# Patient Record
Sex: Female | Born: 1935 | Race: White | Hispanic: No | Smoking: Never smoker
Health system: Southern US, Community
[De-identification: ages and names within clinical notes are randomized; demographics above are authoritative.]

## PROBLEM LIST (undated history)

## (undated) DIAGNOSIS — M1990 Unspecified osteoarthritis, unspecified site: Secondary | ICD-10-CM

## (undated) DIAGNOSIS — I639 Cerebral infarction, unspecified: Secondary | ICD-10-CM

## (undated) DIAGNOSIS — C50319 Malignant neoplasm of lower-inner quadrant of unspecified female breast: Secondary | ICD-10-CM

## (undated) DIAGNOSIS — C44621 Squamous cell carcinoma of skin of unspecified upper limb, including shoulder: Secondary | ICD-10-CM

## (undated) DIAGNOSIS — E538 Deficiency of other specified B group vitamins: Secondary | ICD-10-CM

## (undated) DIAGNOSIS — I10 Essential (primary) hypertension: Secondary | ICD-10-CM

## (undated) DIAGNOSIS — C4490 Unspecified malignant neoplasm of skin, unspecified: Secondary | ICD-10-CM

## (undated) DIAGNOSIS — G629 Polyneuropathy, unspecified: Secondary | ICD-10-CM

## (undated) DIAGNOSIS — R7303 Prediabetes: Secondary | ICD-10-CM

## (undated) DIAGNOSIS — C7640 Malignant neoplasm of unspecified upper limb: Secondary | ICD-10-CM

## (undated) DIAGNOSIS — K635 Polyp of colon: Secondary | ICD-10-CM

## (undated) DIAGNOSIS — D2360 Other benign neoplasm of skin of unspecified upper limb, including shoulder: Secondary | ICD-10-CM

## (undated) DIAGNOSIS — C50919 Malignant neoplasm of unspecified site of unspecified female breast: Secondary | ICD-10-CM

## (undated) HISTORY — DX: Unspecified osteoarthritis, unspecified site: M19.90

## (undated) HISTORY — DX: Other benign neoplasm of skin of unspecified upper limb, including shoulder: D23.60

## (undated) HISTORY — DX: Malignant neoplasm of lower-inner quadrant of unspecified female breast: C50.319

## (undated) HISTORY — DX: Essential (primary) hypertension: I10

## (undated) HISTORY — DX: Squamous cell carcinoma of skin of unspecified upper limb, including shoulder: C44.621

## (undated) HISTORY — DX: Prediabetes: R73.03

## (undated) HISTORY — DX: Polyp of colon: K63.5

## (undated) HISTORY — DX: Polyneuropathy, unspecified: G62.9

## (undated) HISTORY — DX: Malignant neoplasm of unspecified upper limb: C76.40

## (undated) HISTORY — DX: Deficiency of other specified B group vitamins: E53.8

## (undated) HISTORY — PX: ABDOMINAL HYSTERECTOMY: SHX81

## (undated) HISTORY — PX: COLONOSCOPY W/ POLYPECTOMY: SHX1380

## (undated) HISTORY — PX: CHOLECYSTECTOMY: SHX55

## (undated) HISTORY — DX: Cerebral infarction, unspecified: I63.9

---

## 2002-09-22 HISTORY — PX: BREAST LUMPECTOMY: SHX2

## 2002-10-23 HISTORY — PX: BREAST BIOPSY: SHX20

## 2002-12-02 DIAGNOSIS — C50919 Malignant neoplasm of unspecified site of unspecified female breast: Secondary | ICD-10-CM

## 2002-12-02 HISTORY — DX: Malignant neoplasm of unspecified site of unspecified female breast: C50.919

## 2004-10-28 ENCOUNTER — Ambulatory Visit: Payer: Self-pay | Admitting: General Surgery

## 2005-06-25 ENCOUNTER — Ambulatory Visit: Payer: Self-pay | Admitting: Internal Medicine

## 2005-07-23 ENCOUNTER — Ambulatory Visit: Payer: Self-pay | Admitting: Internal Medicine

## 2005-08-22 ENCOUNTER — Ambulatory Visit: Payer: Self-pay | Admitting: Internal Medicine

## 2005-10-27 ENCOUNTER — Ambulatory Visit: Payer: Self-pay | Admitting: General Surgery

## 2006-04-27 ENCOUNTER — Ambulatory Visit: Payer: Self-pay | Admitting: General Surgery

## 2006-06-23 ENCOUNTER — Ambulatory Visit: Payer: Self-pay | Admitting: General Surgery

## 2006-09-22 HISTORY — PX: COLONOSCOPY: SHX174

## 2006-11-06 ENCOUNTER — Ambulatory Visit: Payer: Self-pay | Admitting: Internal Medicine

## 2006-11-17 ENCOUNTER — Ambulatory Visit: Payer: Self-pay | Admitting: General Surgery

## 2007-11-21 ENCOUNTER — Ambulatory Visit: Payer: Self-pay | Admitting: Radiation Oncology

## 2007-11-29 ENCOUNTER — Ambulatory Visit: Payer: Self-pay | Admitting: General Surgery

## 2008-12-05 ENCOUNTER — Ambulatory Visit: Payer: Self-pay | Admitting: General Surgery

## 2009-12-06 ENCOUNTER — Ambulatory Visit: Payer: Self-pay | Admitting: General Surgery

## 2010-11-04 ENCOUNTER — Ambulatory Visit: Payer: Self-pay | Admitting: General Surgery

## 2010-12-10 ENCOUNTER — Ambulatory Visit: Payer: Self-pay | Admitting: General Surgery

## 2011-09-23 HISTORY — PX: OTHER SURGICAL HISTORY: SHX169

## 2012-03-03 ENCOUNTER — Ambulatory Visit: Payer: Self-pay | Admitting: General Surgery

## 2012-03-13 DIAGNOSIS — I10 Essential (primary) hypertension: Secondary | ICD-10-CM

## 2012-04-12 ENCOUNTER — Ambulatory Visit: Payer: Self-pay | Admitting: General Surgery

## 2012-04-12 LAB — URINALYSIS, COMPLETE
Bacteria: NONE SEEN
Bilirubin,UR: NEGATIVE
Blood: NEGATIVE
Glucose,UR: NEGATIVE mg/dL (ref 0–75)
Ketone: NEGATIVE
Leukocyte Esterase: NEGATIVE
Nitrite: NEGATIVE
Ph: 5 (ref 4.5–8.0)
Protein: NEGATIVE
RBC,UR: <1 /HPF (ref 0–5)
Specific Gravity: 1.008 (ref 1.003–1.030)
Squamous Epithelial: <1
Transitional Epi: <1
WBC UR: 2 /HPF (ref 0–5)

## 2012-04-19 ENCOUNTER — Ambulatory Visit: Payer: Self-pay | Admitting: General Surgery

## 2012-04-19 HISTORY — PX: THUMB AMPUTATION: SHX804

## 2012-04-21 LAB — PATHOLOGY REPORT

## 2012-09-22 IMAGING — CR DG CHEST 2V
1 series · 2 of 2 positions shown · non-contrast
Comparison: none

REASON FOR EXAM: breast ca, htn
COMMENTS:

PROCEDURE:     DXR - DXR CHEST PA (OR AP) AND LATERAL  - April 12, 2012 [DATE]
RESULT:     The lungs are clear. The cardiac silhouette and visualized bony
skeleton are unremarkable.

[Series 1: pa · 0.17mm/px · 2 of 2 slices shown]
[im 1/2]
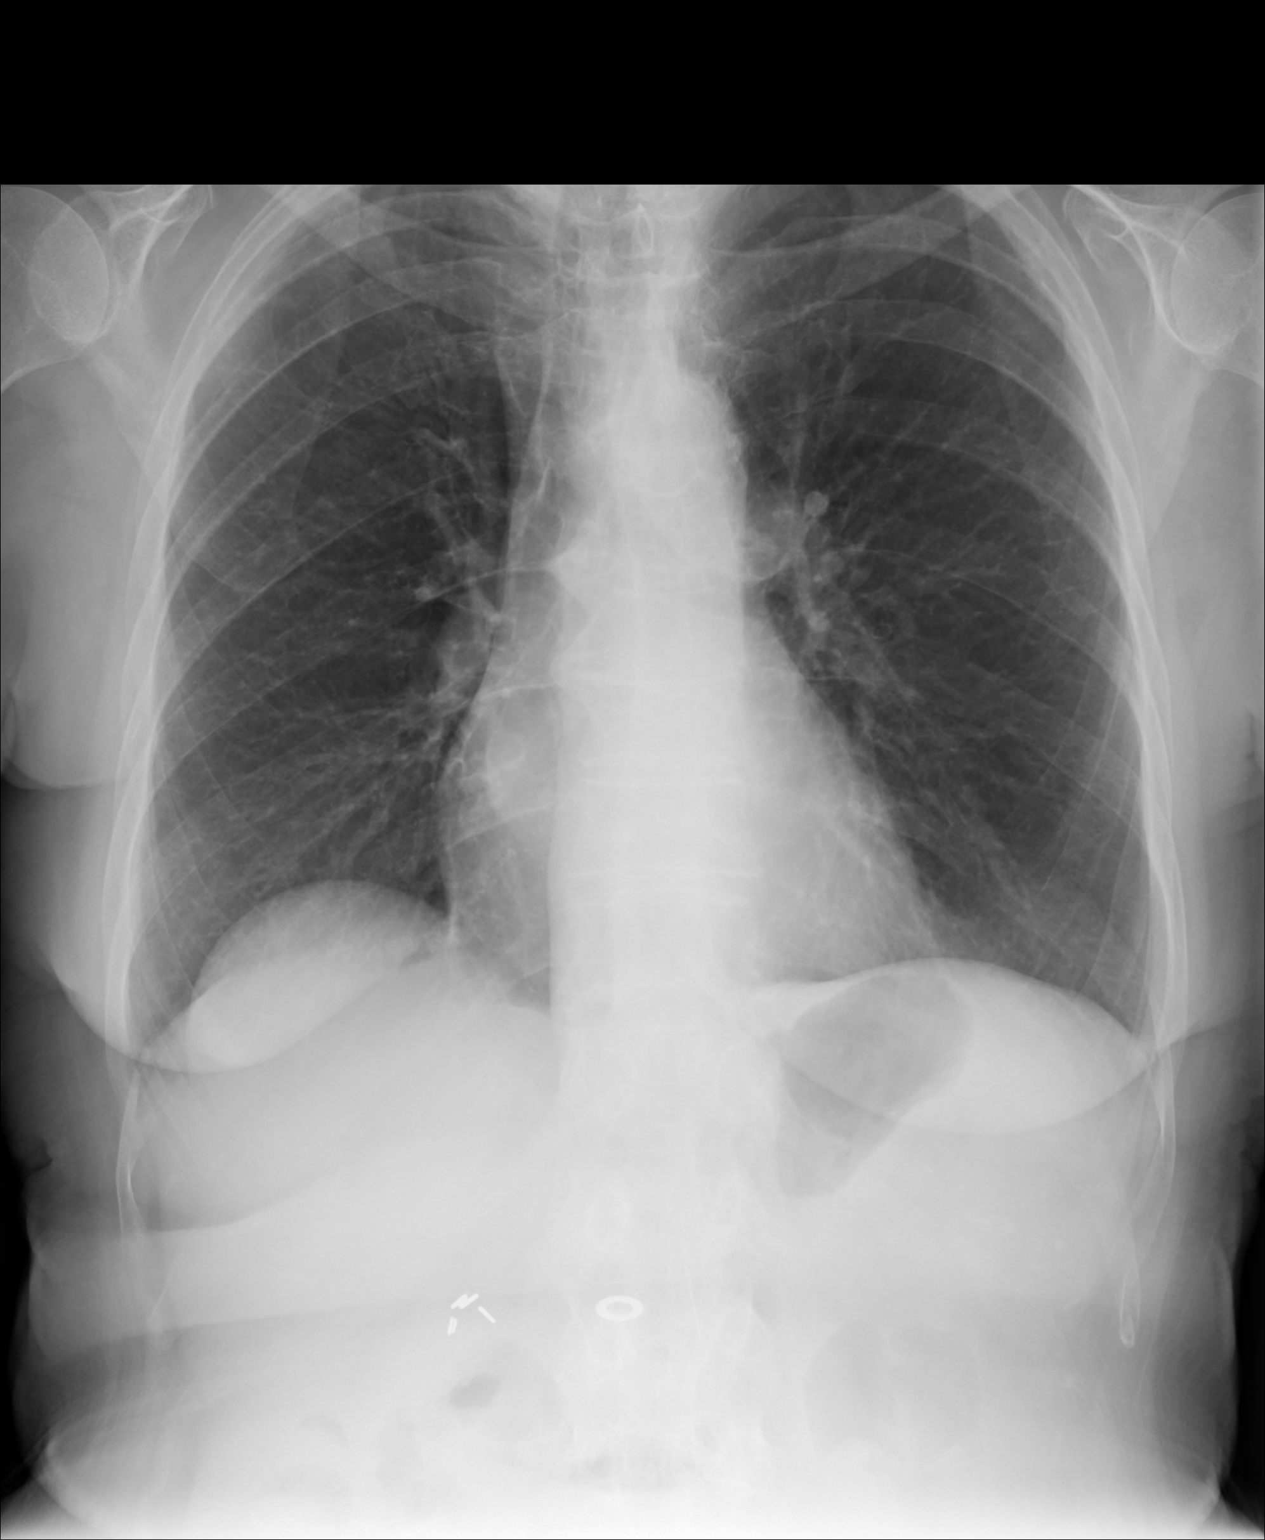
[im 2/2]
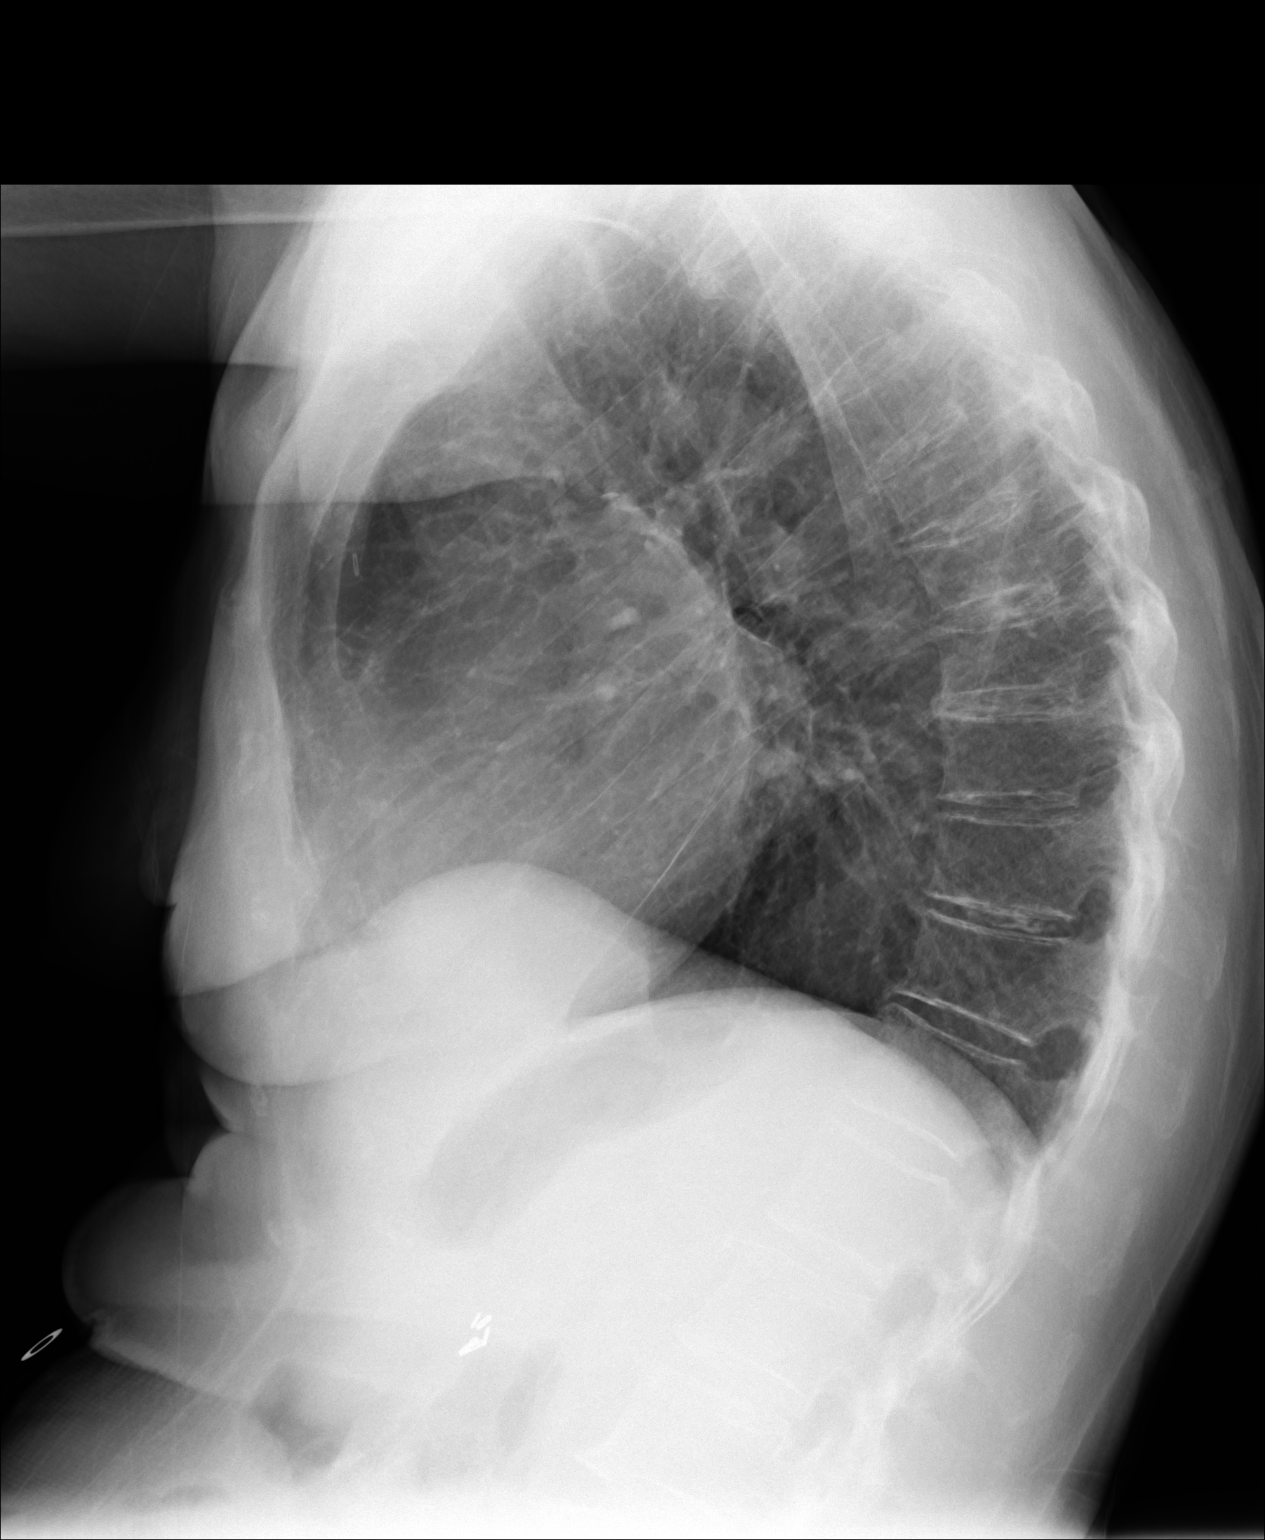

[2 of 2 positions shown; findings below may reference images not displayed]

IMPRESSION: 1. Chest radiograph without evidence of acute cardiopulmonary disease.

## 2013-03-17 ENCOUNTER — Ambulatory Visit: Payer: Self-pay | Admitting: General Surgery

## 2013-03-24 ENCOUNTER — Ambulatory Visit: Payer: Self-pay | Admitting: General Surgery

## 2013-03-24 ENCOUNTER — Encounter: Payer: Self-pay | Admitting: General Surgery

## 2013-04-18 ENCOUNTER — Encounter: Payer: Self-pay | Admitting: General Surgery

## 2013-04-20 ENCOUNTER — Ambulatory Visit: Payer: Self-pay | Admitting: General Surgery

## 2013-05-04 ENCOUNTER — Ambulatory Visit (INDEPENDENT_AMBULATORY_CARE_PROVIDER_SITE_OTHER): Payer: Medicare Other | Admitting: General Surgery

## 2013-05-04 ENCOUNTER — Encounter: Payer: Self-pay | Admitting: General Surgery

## 2013-05-04 VITALS — BP 134/76 | HR 62 | Resp 12 | Ht 69.0 in | Wt 156.0 lb

## 2013-05-04 DIAGNOSIS — Z853 Personal history of malignant neoplasm of breast: Secondary | ICD-10-CM

## 2013-05-04 DIAGNOSIS — C44629 Squamous cell carcinoma of skin of left upper limb, including shoulder: Secondary | ICD-10-CM

## 2013-05-04 DIAGNOSIS — C50311 Malignant neoplasm of lower-inner quadrant of right female breast: Secondary | ICD-10-CM

## 2013-05-04 DIAGNOSIS — C50319 Malignant neoplasm of lower-inner quadrant of unspecified female breast: Secondary | ICD-10-CM

## 2013-05-04 DIAGNOSIS — C44621 Squamous cell carcinoma of skin of unspecified upper limb, including shoulder: Secondary | ICD-10-CM

## 2013-05-04 NOTE — Patient Instructions (Signed)
Patient to return in one year. 

## 2013-05-04 NOTE — Progress Notes (Signed)
Patient ID: Emily Moyer, female   DOB: [birth year], 79 y.o.   MRN: [id number]  Chief Complaint  Patient presents with  . Follow-up    mammogram    HPI Emily Moyer is a 79 y.o. female presents today for follow-up of right breast cancer diagnosed November 03, 2002 status post lumpectomy and sentinel node biopsy. The patient's tumor was 2 cm in diameter and was node negative on sentinel node exam. She completed 5 years of therapy with Tamoxifen and was started on Femara in 2010. This was a 2 cm tumor without evidence of venous/lymphatic invasion. One sentinel node was free of tumor. This would be a T1c, N0,M0 lesion.. The most recent mammogram was done on 03/24/13 with a birad category 2. Patient does perform self breast checks and get regular mammograms.    The patient underwent amputation of the left thumb and sentinel node biopsy for squamous cell carcinoma in July 2013.  The patient's only complaint at this time some arthritis involving the right hand making it at times difficult to grip items.   HPI  Past Medical History  Diagnosis Date  . Benign neoplasm of skin of upper limb, including shoulder   . Hypertension   . Arthritis   . Stroke   . Neuropathy     legs  . Malignant neoplasm of lower-inner quadrant of female breast   . Malignant neoplasm of upper limb     left thumb  . Colon polyp     Past Surgical History  Procedure Laterality Date  . Breast lumpectomy Right 2004  . Cholecystectomy    . Abdominal hysterectomy    . Thumb amputation Left 2013  . Colonoscopy  2008  . Biopsy thumb Left 2013     0.4 x 1.8 x 1.8 cm histologic grade 2 invasive squamous cell. 7 mm from in situ carcinoma to resection, 1.2 cm from invasive cancer. Negative margins.pT1,N0    History reviewed. No pertinent family history.  Social History History  Substance Use Topics  . Smoking status: Never Smoker   . Smokeless tobacco: Current User    Types: Snuff     Comment: used 35  years  . Alcohol Use: No    No Known Allergies  Current Outpatient Prescriptions  Medication Sig Dispense Refill  . aspirin 81 MG tablet Take 81 mg by mouth daily.      Marland Kitchen atorvastatin (LIPITOR) 40 MG tablet Take 1 tablet by mouth daily.      . Cholecalciferol (VITAMIN D-3) 1000 UNITS CAPS Take 1 capsule by mouth daily.      . felodipine (PLENDIL) 5 MG 24 hr tablet Take 1 tablet by mouth daily.      Marland Kitchen lisinopril-hydrochlorothiazide (PRINZIDE,ZESTORETIC) 20-12.5 MG per tablet Take 1 tablet by mouth daily.      . metoprolol succinate (TOPROL-XL) 100 MG 24 hr tablet Take 1 tablet by mouth daily.      . Omega-3 Fatty Acids (FISH OIL) 1200 MG CAPS Take 1 capsule by mouth daily.      . vitamin B-12 (CYANOCOBALAMIN) 500 MCG tablet Take 500 mcg by mouth daily.       No current facility-administered medications for this visit.    Review of Systems Review of Systems  Constitutional: Negative.   Respiratory: Negative.   Cardiovascular: Negative.     Blood pressure 134/76, pulse 62, resp. rate 12, height 5\' 9"  (1.753 m), weight 156 lb (70.761 kg).  Physical Exam Physical Exam  Constitutional: She is oriented to  person, place, and time. She appears well-developed and well-nourished.  Cardiovascular: Normal rate, regular rhythm, normal heart sounds, intact distal pulses and normal pulses.    Right ankle swelling .  Pulmonary/Chest: Breath sounds normal. Right breast exhibits no inverted nipple, no mass, no nipple discharge, no skin change and no tenderness. Left breast exhibits no inverted nipple, no mass, no nipple discharge, no skin change and no tenderness.  Well healed incision at 5 o'clock left breast.  Musculoskeletal:  5 mm lesion on the dorsal on the right hand. Smoothly marginated, not suspicious for basal cell carcinoma. No ulceration.   The left thrumb amputation is well healed. No evidence of local recurrence.  Lymphadenopathy:    She has no cervical adenopathy.    She has no  axillary adenopathy.  Neurological: She is alert and oriented to person, place, and time.  Skin: Skin is warm and dry.    Data Reviewed Bilateral mammogram dated 03/24/2013 showed no interval change. BI-RAD-2.  Assessment   Doing well without 10 year status post left breast wide excision and radiation therapy.  No evidence of recurrent squamous cell carcinoma involving the left upper extremity.      Plan    We'll arrange for a followup examination with bilateral mammograms in one year. The patient has completed nearly 10 years antiestrogen therapy       Earline Mayotte 05/05/2013, 9:00 AM

## 2013-05-05 ENCOUNTER — Encounter: Payer: Self-pay | Admitting: General Surgery

## 2013-05-05 DIAGNOSIS — C50311 Malignant neoplasm of lower-inner quadrant of right female breast: Secondary | ICD-10-CM | POA: Insufficient documentation

## 2013-05-05 DIAGNOSIS — C44621 Squamous cell carcinoma of skin of unspecified upper limb, including shoulder: Secondary | ICD-10-CM | POA: Insufficient documentation

## 2013-05-05 DIAGNOSIS — C50319 Malignant neoplasm of lower-inner quadrant of unspecified female breast: Secondary | ICD-10-CM | POA: Insufficient documentation

## 2013-05-05 HISTORY — DX: Squamous cell carcinoma of skin of unspecified upper limb, including shoulder: C44.621

## 2013-07-01 ENCOUNTER — Telehealth: Payer: Self-pay

## 2013-07-01 NOTE — Telephone Encounter (Signed)
Her daughter called and asked if you could assess and possible remove a skin growth on the top of Bannie's hand. She says that is is dime sized and is on the back of her hand, no fingers involved. If not she would like to know whom she should see about this.

## 2013-07-02 NOTE — Telephone Encounter (Signed)
Schedule appt for assessment and possible removal of lesion from hand.

## 2013-07-21 ENCOUNTER — Ambulatory Visit (INDEPENDENT_AMBULATORY_CARE_PROVIDER_SITE_OTHER): Payer: Medicare Other | Admitting: General Surgery

## 2013-07-21 ENCOUNTER — Encounter: Payer: Self-pay | Admitting: General Surgery

## 2013-07-21 VITALS — BP 130/76 | HR 76 | Resp 16 | Ht 69.0 in | Wt 159.0 lb

## 2013-07-21 DIAGNOSIS — L989 Disorder of the skin and subcutaneous tissue, unspecified: Secondary | ICD-10-CM

## 2013-07-21 DIAGNOSIS — Z85828 Personal history of other malignant neoplasm of skin: Secondary | ICD-10-CM

## 2013-07-21 DIAGNOSIS — D485 Neoplasm of uncertain behavior of skin: Secondary | ICD-10-CM

## 2013-07-21 NOTE — Patient Instructions (Signed)
Patient to return in one week nurse  

## 2013-07-21 NOTE — Progress Notes (Signed)
Patient ID: Britany Callicott, female   DOB: 1924/08/15, 79 y.o.   MRN: [id number]  Chief Complaint  Patient presents with  . Other    Assessment Skin growth    HPI Emily Moyer is a 79 y.o. female here today for assessment skin growth on right hand.She states been there for about six months.   HPI  Past Medical History  Diagnosis Date  . Benign neoplasm of skin of upper limb, including shoulder   . Hypertension   . Arthritis   . Stroke   . Neuropathy     legs  . Colon polyp   . Malignant neoplasm of lower-inner quadrant of female breast     11/03/2002: T1 C., N0 invasive mammary carcinoma, ER-positive, HER-2/neu not overexpressing. Treated with wide excision and sentinel node biopsy.  . Malignant neoplasm of upper limb     left thumb, T1, N0 treated by partial thumb amputation, sentinel node biopsy.    Past Surgical History  Procedure Laterality Date  . Breast lumpectomy Right 2004  . Cholecystectomy    . Abdominal hysterectomy    . Thumb amputation Left 2013  . Colonoscopy  2008  . Biopsy thumb Left 2013     0.4 x 1.8 x 1.8 cm histologic grade 2 invasive squamous cell. 7 mm from in situ carcinoma to resection, 1.2 cm from invasive cancer. Negative margins.pT1,N0  . Colonoscopy w/ polypectomy  06/23/2006 transverse colon tubular adenoma without high-grade dysplasia.    No family history on file.  Social History History  Substance Use Topics  . Smoking status: Never Smoker   . Smokeless tobacco: Current User    Types: Snuff     Comment: used 35 years  . Alcohol Use: No    No Known Allergies  Current Outpatient Prescriptions  Medication Sig Dispense Refill  . aspirin 81 MG tablet Take 81 mg by mouth daily.      Marland Kitchen atorvastatin (LIPITOR) 40 MG tablet Take 1 tablet by mouth daily.      . Cholecalciferol (VITAMIN D-3) 1000 UNITS CAPS Take 1 capsule by mouth daily.      . ciprofloxacin (CIPRO) 250 MG tablet Take 1 tablet by mouth daily.      . felodipine  (PLENDIL) 5 MG 24 hr tablet Take 1 tablet by mouth daily.      Marland Kitchen lisinopril-hydrochlorothiazide (PRINZIDE,ZESTORETIC) 20-12.5 MG per tablet Take 1 tablet by mouth daily.      . metoprolol succinate (TOPROL-XL) 100 MG 24 hr tablet Take 1 tablet by mouth daily.      . Omega-3 Fatty Acids (FISH OIL) 1200 MG CAPS Take 1 capsule by mouth daily.      . vitamin B-12 (CYANOCOBALAMIN) 500 MCG tablet Take 500 mcg by mouth daily.       No current facility-administered medications for this visit.    Review of Systems Review of Systems  Constitutional: Negative.   Respiratory: Negative.   Cardiovascular: Negative.     Blood pressure 130/76, pulse 76, resp. rate 16, height 5\' 9"  (1.753 m), weight 159 lb (72.122 kg).  Physical Exam Physical Exam Examination of the right hand showed a 1.2 cm mass on the dorsum overlying the third metacarpal. This was raised about 5-6 mm over the level of the surrounding skin. No ulceration.  No axillary adenopathy. No evidence of intrinsic lesions.    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  Plan    Excision was recommended and accepted. 10 cc of 0.5% Xylocaine with 0.25% Marcaine with 1-200,000 units of epinephrine was injected and well tolerated. The area was prepped with ChloraPrep and draped. The lesion was excised through elliptical incision. Hemostasis was with 3-0 Vicryl ties. The deep tissue was approximated with 3-0 Vicryl deep sutures followed by a running 4-0 nylon skin closure. Telfa and Tegaderm dressing applied followed by a light compressive wrap with 4 x 4 gauze, roller gauze and a 2 inch Ace to be worn for 24 hours.  A prescription for Norco 5/325, #20 with the inscription 1 by mouth every 4 hours when necessary for pain was provided.  The patient's daughter Kalis Friese was present during the exam and procedure.       Earline Mayotte 07/22/2013,  12:06 PM

## 2013-07-22 ENCOUNTER — Encounter: Payer: Self-pay | Admitting: General Surgery

## 2013-07-22 ENCOUNTER — Telehealth: Payer: Self-pay | Admitting: General Surgery

## 2013-07-22 DIAGNOSIS — L989 Disorder of the skin and subcutaneous tissue, unspecified: Secondary | ICD-10-CM | POA: Insufficient documentation

## 2013-07-22 LAB — PATHOLOGY

## 2013-07-22 NOTE — Telephone Encounter (Signed)
Left a message that report was OK. ( Squamous cell cancer, completely excised.)

## 2013-07-28 ENCOUNTER — Ambulatory Visit (INDEPENDENT_AMBULATORY_CARE_PROVIDER_SITE_OTHER): Payer: Self-pay | Admitting: *Deleted

## 2013-07-28 DIAGNOSIS — L989 Disorder of the skin and subcutaneous tissue, unspecified: Secondary | ICD-10-CM

## 2013-07-28 NOTE — Patient Instructions (Signed)
The patient is aware to call back for any questions or concerns.  

## 2013-08-02 ENCOUNTER — Encounter: Payer: Self-pay | Admitting: General Surgery

## 2014-03-18 DIAGNOSIS — N183 Chronic kidney disease, stage 3 unspecified: Secondary | ICD-10-CM | POA: Insufficient documentation

## 2014-03-18 DIAGNOSIS — R809 Proteinuria, unspecified: Secondary | ICD-10-CM | POA: Insufficient documentation

## 2014-03-18 DIAGNOSIS — D649 Anemia, unspecified: Secondary | ICD-10-CM | POA: Insufficient documentation

## 2014-03-18 DIAGNOSIS — I10 Essential (primary) hypertension: Secondary | ICD-10-CM

## 2014-03-18 DIAGNOSIS — R7303 Prediabetes: Secondary | ICD-10-CM

## 2014-03-18 DIAGNOSIS — E559 Vitamin D deficiency, unspecified: Secondary | ICD-10-CM | POA: Insufficient documentation

## 2014-03-18 DIAGNOSIS — G629 Polyneuropathy, unspecified: Secondary | ICD-10-CM

## 2014-03-18 DIAGNOSIS — E538 Deficiency of other specified B group vitamins: Secondary | ICD-10-CM

## 2014-03-18 DIAGNOSIS — E782 Mixed hyperlipidemia: Secondary | ICD-10-CM | POA: Insufficient documentation

## 2014-03-18 HISTORY — DX: Essential (primary) hypertension: I10

## 2014-03-18 HISTORY — DX: Prediabetes: R73.03

## 2014-03-18 HISTORY — DX: Deficiency of other specified B group vitamins: E53.8

## 2014-03-18 HISTORY — DX: Polyneuropathy, unspecified: G62.9

## 2014-05-17 ENCOUNTER — Ambulatory Visit: Payer: Medicare Other | Admitting: General Surgery

## 2014-05-25 ENCOUNTER — Ambulatory Visit: Payer: Medicare Other | Admitting: General Surgery

## 2014-06-09 ENCOUNTER — Ambulatory Visit: Payer: Self-pay | Admitting: General Surgery

## 2014-06-14 ENCOUNTER — Encounter: Payer: Self-pay | Admitting: General Surgery

## 2014-06-15 ENCOUNTER — Encounter: Payer: Self-pay | Admitting: General Surgery

## 2014-06-19 ENCOUNTER — Encounter: Payer: Self-pay | Admitting: General Surgery

## 2014-06-19 ENCOUNTER — Ambulatory Visit (INDEPENDENT_AMBULATORY_CARE_PROVIDER_SITE_OTHER): Payer: Medicare Other | Admitting: General Surgery

## 2014-06-19 VITALS — BP 122/68 | HR 72 | Resp 14 | Ht 69.0 in | Wt 151.0 lb

## 2014-06-19 DIAGNOSIS — Z853 Personal history of malignant neoplasm of breast: Secondary | ICD-10-CM

## 2014-06-19 NOTE — Progress Notes (Signed)
Patient ID: Emily Moyer, female   DOB: 07/23/1924, 80 y.o.   MRN: [id number]  Chief Complaint  Patient presents with  . Follow-up    mammogram    HPI Emily Moyer is a 80 y.o. female who presents for a breast evaluation. The most recent mammogram was done on 06/09/14.  Patient does perform regular self breast checks and gets regular mammograms done.   The patient's most significant complaint today is that ongoing neuropathy involving the lower extremities unresponsive to multiple medications. The patient is accompanied by her daughter, Emily Moyer.  HPI  Past Medical History  Diagnosis Date  . Benign neoplasm of skin of upper limb, including shoulder   . Hypertension   . Arthritis   . Stroke   . Neuropathy     legs  . Colon polyp   . Malignant neoplasm of lower-inner quadrant of female breast     11/03/2002: T1 C., N0 invasive mammary carcinoma, ER-positive, HER-2/neu not overexpressing. Treated with wide excision and sentinel node biopsy.  . Malignant neoplasm of upper limb     left thumb, T1, N0 treated by partial thumb amputation, sentinel node biopsy.    Past Surgical History  Procedure Laterality Date  . Breast lumpectomy Right 2004  . Cholecystectomy    . Abdominal hysterectomy    . Thumb amputation Left 2013  . Colonoscopy  2008  . Biopsy thumb Left 2013     0.4 x 1.8 x 1.8 cm histologic grade 2 invasive squamous cell. 7 mm from in situ carcinoma to resection, 1.2 cm from invasive cancer. Negative margins.pT1,N0  . Colonoscopy w/ polypectomy  06/23/2006 transverse colon tubular adenoma without high-grade dysplasia.    No family history on file.  Social History History  Substance Use Topics  . Smoking status: Never Smoker   . Smokeless tobacco: Current User    Types: Snuff     Comment: used 35 years  . Alcohol Use: No    No Known Allergies  Current Outpatient Prescriptions  Medication Sig Dispense Refill  . amLODipine (NORVASC)  10 MG tablet       . aspirin 81 MG tablet Take 81 mg by mouth daily.      Marland Kitchen atorvastatin (LIPITOR) 40 MG tablet Take 1 tablet by mouth daily.      . Cholecalciferol (VITAMIN D-3) 1000 UNITS CAPS Take 1 capsule by mouth daily.      . ciprofloxacin (CIPRO) 250 MG tablet Take 1 tablet by mouth daily.      . felodipine (PLENDIL) 5 MG 24 hr tablet Take 1 tablet by mouth daily.      Marland Kitchen lisinopril-hydrochlorothiazide (PRINZIDE,ZESTORETIC) 20-12.5 MG per tablet Take 1 tablet by mouth daily.      . metoprolol succinate (TOPROL-XL) 100 MG 24 hr tablet Take 1 tablet by mouth daily.      . Omega-3 Fatty Acids (FISH OIL) 1200 MG CAPS Take 1 capsule by mouth daily.      . vitamin B-12 (CYANOCOBALAMIN) 500 MCG tablet Take 500 mcg by mouth daily.       No current facility-administered medications for this visit.    Review of Systems Review of Systems  Constitutional: Negative.   Respiratory: Negative.   Cardiovascular: Negative.     Blood pressure 122/68, pulse 72, resp. rate 14, height _0  (1.753 m), weight 151 lb (68.493 kg).  Physical Exam Physical Exam  Constitutional: She is oriented to person, place, and time. She appears well-developed and well-nourished.  Eyes: Conjunctivae  are normal. No scleral icterus.  Neck: Neck supple.  Cardiovascular: Normal rate, regular rhythm and normal heart sounds.   +1 edema   Pulmonary/Chest: Effort normal and breath sounds normal. Right breast exhibits no inverted nipple, no mass, no nipple discharge, no skin change and no tenderness. Left breast exhibits no inverted nipple, no mass, no nipple discharge, no skin change and no tenderness.  Right breast 4 o'clock radial incision looks clean and well healed  Lymphadenopathy:    She has no cervical adenopathy.    She has no axillary adenopathy.  Neurological: She is alert and oriented to person, place, and time.  Skin: Skin is warm and dry.    Data Reviewed Bilateral mammograms at June 09, 2014 were  benign. BI-RAD-1.  Assessment    Benign breast exam.  No evidence of recurrent squamous cell carcinoma of the left palm or evidence of metastatic disease.     Plan    We'll plan for a follow up examination in one year with bilateral screening mammograms at that time.     PCP: Berton Bon 06/20/2014, 9:12 PM

## 2014-06-19 NOTE — Patient Instructions (Signed)
Patient will be asked to return to the office in one year with a bilateral screening mammogram. 

## 2014-06-20 DIAGNOSIS — Z853 Personal history of malignant neoplasm of breast: Secondary | ICD-10-CM | POA: Insufficient documentation

## 2014-07-24 ENCOUNTER — Encounter: Payer: Self-pay | Admitting: General Surgery

## 2015-01-09 NOTE — Op Note (Signed)
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     OPERATIVE PROCEDURE: Left thumb amputation, sentinel node biopsy.   SURGEON: Hervey Ard, MD.  ANESTHESIA: General by LMA under Dr. Andree Elk, Marcaine 0.5% plain 24 mL local infiltration.   ESTIMATED BLOOD LOSS: 10 mL.   CLINICAL NOTE: This 81 year old woman recently was identified with a mass involving the medial margin of the left thumb nail. Biopsy showed invasive squamous cell carcinoma. She was felt to be best managed by amputation and sentinel node biopsy.   DESCRIPTION OF PROCEDURE:  The patient was injected with technetium sulfur colloid prior to the procedure and lymphoscintigraphy showed three nodes in the left axilla. The patient was brought to the Operating Room and underwent general anesthesia. The left hand and forearm as well as the left axilla were prepped with ChloraPrep and draped. A stockinette was used for the arm. The axilla was approached first. The gamma finder showed increased uptake. 2 mL of methylene blue diluted 1:2 with saline had been injected into the base of the thumb prior to prepping, No blue nodes were identified. Three hot blue nodes were appreciated. #1 and 3 were anterior in the axilla, #2 was posterior. Touch preps were negative for macro metastatic disease. The axilla was closed in layers after achieving hemostasis with electrocautery. 2-0 Vicryl was used for the deep layer and a running 4-0 Vicryl subcuticular suture used for the skin.   Attention was turned to the thumb. A fishmouth incision was made at the midportion of the proximal phalanx. The skin was incised sharply and the remaining dissection with electrocautery. The vascular pedicles were controlled with 3-0 Vicryl ties. A  periosteal elevator was used to free the proximal phalanx back to just above the joint with the metacarpal. This was then divided smoothly. The soft tissues were approximated over the residual piece of proximal phalanx with 3-0 Vicryl figure-of-eight sutures. The subcutaneous tissue was approximated in a similar fashion. The skin was closed with interrupted 4-0 nylon simple sutures. Telfa fluff gauze, Kerlix and an Ace wrap was used for the hand and Telfa and Tegaderm used for the axilla. The patient tolerated the procedure well and was taken to the recovery room in stable condition.   ____________________________ Robert Bellow, MD jwb:ap D: 04/19/2012 12:42:56 ET T: 04/19/2012 13:54:08 ET JOB#: [id number]  cc: Robert Bellow, MD, <Dictator> Emily Flake. [name], MD Emily Amedeo Kinsman MD ELECTRONICALLY SIGNED 04/19/2012 17:31

## 2015-03-29 DIAGNOSIS — Z79899 Other long term (current) drug therapy: Secondary | ICD-10-CM | POA: Insufficient documentation

## 2015-04-18 ENCOUNTER — Other Ambulatory Visit: Payer: Self-pay

## 2015-04-18 DIAGNOSIS — Z1231 Encounter for screening mammogram for malignant neoplasm of breast: Secondary | ICD-10-CM

## 2015-06-06 ENCOUNTER — Ambulatory Visit (INDEPENDENT_AMBULATORY_CARE_PROVIDER_SITE_OTHER): Payer: Medicare Other | Admitting: Obstetrics and Gynecology

## 2015-06-06 ENCOUNTER — Encounter: Payer: Self-pay | Admitting: Obstetrics and Gynecology

## 2015-06-06 VITALS — BP 159/71 | HR 88 | Temp 97.7°F | Resp 16 | Ht 69.0 in | Wt 136.2 lb

## 2015-06-06 DIAGNOSIS — R300 Dysuria: Secondary | ICD-10-CM | POA: Diagnosis not present

## 2015-06-06 LAB — URINALYSIS, COMPLETE
Bilirubin, UA: NEGATIVE
Glucose, UA: NEGATIVE
Ketones, UA: NEGATIVE
NITRITE UA: NEGATIVE
PH UA: 5.5 (ref 5.0–7.5)
Protein, UA: NEGATIVE
Specific Gravity, UA: <1.005 — ABNORMAL LOW (ref 1.005–1.030)
Urobilinogen, Ur: 0.2 mg/dL (ref 0.2–1.0)

## 2015-06-06 LAB — BLADDER SCAN AMB NON-IMAGING

## 2015-06-06 LAB — MICROSCOPIC EXAMINATION: RBC MICROSCOPIC, UA: NONE SEEN /HPF (ref 0–?)

## 2015-06-06 MED ORDER — CEPHALEXIN 500 MG PO CAPS
500.0000 mg | ORAL_CAPSULE | Freq: Two times a day (BID) | ORAL | Status: DC
Start: 2015-06-06 — End: 2016-11-21

## 2015-06-06 NOTE — Progress Notes (Signed)
06/06/2015 4:41 PM   Park Pope Cegielski 1924/07/19 [id number]  Referring provider: Ezequiel Kayser, MD [address] [place], [place] [address]  Chief Complaint  Patient presents with  . Dysuria    HPI: Patient is an 81 year old female with a history of recurrent urinary tract infections presenting today with complaints of dysuria. She is previously been seen by PA Zara Council. He was instructed to use vaginal estrogen cream to improve vaginal atrophy but daughter states that the pharmacy they went to did not carry medication and that she was given a pill? Patient reports no issues until approximately about a week ago when she began to experience dysuria. She denies fevers, flank pain, frequency or gross hematuria. She does state that she experiences nocturia 1-3 times per night but also has significant lower extremity edema. Daughter states patient does have a history of dementia and is a poor historian.     PMH: Past Medical History  Diagnosis Date  . Benign neoplasm of skin of upper limb, including shoulder   . Hypertension   . Arthritis   . Stroke   . Neuropathy     legs  . Colon polyp   . Malignant neoplasm of lower-inner quadrant of female breast     11/03/2002: T1 C., N0 invasive mammary carcinoma, ER-positive, HER-2/neu not overexpressing. Treated with wide excision and sentinel node biopsy.  . Malignant neoplasm of upper limb     left thumb, T1, N0 treated by partial thumb amputation, sentinel node biopsy.    Surgical History: Past Surgical History  Procedure Laterality Date  . Breast lumpectomy Right 2004  . Cholecystectomy    . Abdominal hysterectomy    . Thumb amputation Left 2013  . Colonoscopy  2008  . Biopsy thumb Left 2013     0.4 x 1.8 x 1.8 cm histologic grade 2 invasive squamous cell. 7 mm from in situ carcinoma to resection, 1.2 cm from invasive cancer. Negative margins.pT1,N0  . Colonoscopy w/ polypectomy  06/23/2006 transverse colon  tubular adenoma without high-grade dysplasia.    Home Medications:    Medication List       This list is accurate as of: 06/06/15  4:41 PM.  Always use your most recent med list.               amLODipine 10 MG tablet  Commonly known as:  NORVASC     aspirin 81 MG tablet  Take 81 mg by mouth daily.     atorvastatin 40 MG tablet  Commonly known as:  LIPITOR  Take 1 tablet by mouth daily.     cephALEXin 500 MG capsule  Commonly known as:  KEFLEX  Take 1 capsule (500 mg total) by mouth 2 (two) times daily.     ciprofloxacin 250 MG tablet  Commonly known as:  CIPRO  Take 1 tablet by mouth daily.     felodipine 5 MG 24 hr tablet  Commonly known as:  PLENDIL  Take 1 tablet by mouth daily.     Fish Oil 1200 MG Caps  Take 1 capsule by mouth daily.     lisinopril-hydrochlorothiazide 20-12.5 MG per tablet  Commonly known as:  PRINZIDE,ZESTORETIC  Take 1 tablet by mouth daily.     metoprolol succinate 100 MG 24 hr tablet  Commonly known as:  TOPROL-XL  Take 1 tablet by mouth daily.     vitamin B-12 500 MCG tablet  Commonly known as:  CYANOCOBALAMIN  Take 500 mcg by mouth daily.  Vitamin D-3 1000 UNITS Caps  Take 1 capsule by mouth daily.        Allergies:  Allergies  Allergen Reactions  . Aspirin-Dipyridamole Er     Other reaction(s): Unknown  . Clonidine Other (See Comments)    Dry mouth  . Cymbalta  [Duloxetine Hcl]     Other reaction(s): Other (See Comments) insomnia    Family History: Family History  Problem Relation Age of Onset  . Family history unknown: Yes    Social History:  reports that she has never smoked. Her smokeless tobacco use includes Snuff. She reports that she does not drink alcohol or use illicit drugs.  ROS: UROLOGY Frequent Urination?: No Hard to postpone urination?: No Burning/pain with urination?: Yes Get up at night to urinate?: No Leakage of urine?: No Urine stream starts and stops?: No Trouble starting stream?:  No Do you have to strain to urinate?: No Blood in urine?: No Urinary tract infection?: No Sexually transmitted disease?: No Injury to kidneys or bladder?: No Painful intercourse?: No Weak stream?: No Currently pregnant?: No Vaginal bleeding?: No Last menstrual period?: n  Gastrointestinal Nausea?: No Vomiting?: No Indigestion/heartburn?: No Diarrhea?: No Constipation?: No  Constitutional Fever: No Night sweats?: No Weight loss?: No Fatigue?: No  Skin Skin rash/lesions?: No Itching?: No  Eyes Blurred vision?: No Double vision?: No  Ears/Nose/Throat Sore throat?: No Sinus problems?: No  Hematologic/Lymphatic Swollen glands?: No Easy bruising?: No  Cardiovascular Leg swelling?: No Chest pain?: No  Respiratory Cough?: No Shortness of breath?: No  Endocrine Excessive thirst?: No  Musculoskeletal Back pain?: Yes Joint pain?: No  Neurological Headaches?: No Dizziness?: No  Psychologic Depression?: No Anxiety?: No  Physical Exam: BP 159/71 mmHg  Pulse 88  Temp(Src) 97.7 F (36.5 C)  Resp 16  Ht 5' 9"  (1.753 m)  Wt 136 lb 3.2 oz (61.78 kg)  BMI 20.10 kg/m2  Constitutional:  Alert and oriented, No acute distress. HEENT: [place] AT, moist mucus membranes.  Trachea midline, no masses. Cardiovascular: No clubbing, cyanosis, or edema. Respiratory: Normal respiratory effort, no increased work of breathing. GI: Abdomen is soft, nontender, nondistended, no abdominal masses GU: No CVA tenderness.  Skin: No rashes, bruises or suspicious lesions. Lymph: No cervical or inguinal adenopathy. Neurologic: Grossly intact, no focal deficits, moving all 4 extremities. Psychiatric: Normal mood and affect.  Laboratory Data: No results found for: WBC, HGB, HCT, MCV, PLT  No results found for: CREATININE  No results found for: PSA  No results found for: TESTOSTERONE  No results found for: HGBA1C  Urinalysis    Component Value Date/Time   COLORURINE Straw  04/12/2012 1058   APPEARANCEUR Clear 04/12/2012 1058   LABSPEC 1.008 04/12/2012 1058   PHURINE 5.0 04/12/2012 1058   GLUCOSEU Negative 04/12/2012 1058   HGBUR Negative 04/12/2012 1058   BILIRUBINUR Negative 04/12/2012 1058   KETONESUR Negative 04/12/2012 1058   PROTEINUR Negative 04/12/2012 1058   NITRITE Negative 04/12/2012 1058   LEUKOCYTESUR Negative 04/12/2012 1058    Pertinent Imaging:  Assessment & Plan: A 63-year-old female with dementia and history of recurrent urinary tract infections presenting today with complaints of dysuria.  1. Dysuria- UA suspicious for infection. Will start Keflex today pending culture results. Patient also encouraged to use vaginal estrogen cream as previously directed. If she cannot obtain the cream I also recommended vaginal lubrications such as coconut or olive oil. - Urinalysis, Complete - BLADDER SCAN AMB NON-IMAGING  2. Recurrent UTI  Return in about 3 months (around 09/05/2015)  for recheck recurent UTI.  Herbert Moors, [facility] [address] [place], [place] [address] [phone]

## 2015-06-06 NOTE — Patient Instructions (Signed)
1.  Start cranberry supplements- 1 tab twice daily 2.  Increase daily water intake

## 2015-06-08 LAB — CULTURE, URINE COMPREHENSIVE

## 2015-06-11 ENCOUNTER — Telehealth: Payer: Self-pay

## 2015-06-11 NOTE — Telephone Encounter (Signed)
-----   Message from Roda Shutters, [place] sent at 06/08/2015  4:55 PM EDT ----- Theodoro Parma notify patient that her urine culture was positive for infection. The Keflex that I prescribed her should treat the infection that she needs to let us know if symptoms do not improve and we can switch her to a different antibiotic.

## 2015-06-11 NOTE — Telephone Encounter (Signed)
Spoke with pt daughter, [name], in reference to UTI. [name] voiced understanding.

## 2015-06-12 ENCOUNTER — Ambulatory Visit
Admission: RE | Admit: 2015-06-12 | Discharge: 2015-06-12 | Disposition: A | Discharge status: Home / Self Care | Payer: Medicare Other | Source: Ambulatory Visit | Attending: General Surgery | Admitting: General Surgery

## 2015-06-12 DIAGNOSIS — Z1231 Encounter for screening mammogram for malignant neoplasm of breast: Secondary | ICD-10-CM

## 2015-06-12 HISTORY — DX: Malignant neoplasm of unspecified site of unspecified female breast: C50.919

## 2015-06-12 HISTORY — DX: Unspecified malignant neoplasm of skin, unspecified: C44.90

## 2015-06-20 ENCOUNTER — Ambulatory Visit (INDEPENDENT_AMBULATORY_CARE_PROVIDER_SITE_OTHER): Payer: Medicare Other | Admitting: General Surgery

## 2015-06-20 ENCOUNTER — Encounter: Payer: Self-pay | Admitting: General Surgery

## 2015-06-20 VITALS — BP 148/68 | HR 68 | Resp 16 | Ht 67.0 in | Wt 136.0 lb

## 2015-06-20 DIAGNOSIS — D0472 Carcinoma in situ of skin of left lower limb, including hip: Secondary | ICD-10-CM | POA: Diagnosis not present

## 2015-06-20 DIAGNOSIS — Z853 Personal history of malignant neoplasm of breast: Secondary | ICD-10-CM

## 2015-06-20 DIAGNOSIS — D0462 Carcinoma in situ of skin of left upper limb, including shoulder: Secondary | ICD-10-CM | POA: Insufficient documentation

## 2015-06-20 NOTE — Patient Instructions (Signed)
Patient will be asked to return to the office in one year with a bilateral screening mammogram. 

## 2015-06-20 NOTE — Progress Notes (Signed)
Patient ID: Emily Moyer, female   DOB: December 24, 1923, 81 y.o.   MRN: [id number]  Chief Complaint  Patient presents with  . Follow-up    mammogram    HPI Emily Moyer is a 81 y.o. female who presents for a breast evaluation. The most recent mammogram was done on 06/12/15 .  Patient does perform regular self breast checks and gets regular mammograms done.   The patient was accompanied by her daughter who was present for the interview and exam.  HPI  Past Medical History  Diagnosis Date  . Benign neoplasm of skin of upper limb, including shoulder   . Hypertension   . Arthritis   . Stroke   . Neuropathy     legs  . Colon polyp   . Malignant neoplasm of lower-inner quadrant of female breast     11/03/2002: T1 C., N0 invasive mammary carcinoma, ER-positive, HER-2/neu not overexpressing. Treated with wide excision and sentinel node biopsy.  . Malignant neoplasm of upper limb     left thumb, T1, N0 treated by partial thumb amputation, sentinel node biopsy.  . Breast cancer 12/02/02    lumpectomy/rad-positive  . Skin cancer     Past Surgical History  Procedure Laterality Date  . Cholecystectomy    . Abdominal hysterectomy    . 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 Colonoscopy  2008  . Biopsy thumb Left 2013     0.4 x 1.8 x 1.8 cm histologic grade 2 invasive squamous cell. 7 mm from in situ carcinoma to resection, 1.2 cm from invasive cancer. Negative margins.pT1,N0  . Colonoscopy w/ polypectomy  06/23/2006 transverse colon tubular adenoma without high-grade dysplasia.  . Breast lumpectomy Right 2004  . Breast biopsy Right 10/23/02    positive    Family History  Problem Relation Age of Onset  . Family history unknown: Yes    Social History Social History  Substance Use Topics  . Smoking status: Never Smoker   . Smokeless tobacco: Current User    Types: Snuff     Comment: used 35 years  . Alcohol Use: No    Allergies   Allergen Reactions  . Aspirin-Dipyridamole Er     Other reaction(s): Unknown  . Clonidine Other (See Comments)    Dry mouth  . Cymbalta  [Duloxetine Hcl]     Other reaction(s): Other (See Comments) insomnia    Current Outpatient Prescriptions  Medication Sig Dispense Refill  . amLODipine (NORVASC) 10 MG tablet     . aspirin 81 MG tablet Take 81 mg by mouth daily.    Marland Kitchen atorvastatin (LIPITOR) 40 MG tablet Take 1 tablet by mouth daily.    . cephALEXin (KEFLEX) 500 MG capsule Take 1 capsule (500 mg total) by mouth 2 (two) times daily. 14 capsule 0  . Cholecalciferol (VITAMIN D-3) 1000 UNITS CAPS Take 1 capsule by mouth daily.    . ciprofloxacin (CIPRO) 250 MG tablet Take 1 tablet by mouth daily.    . felodipine (PLENDIL) 5 MG 24 hr tablet Take 1 tablet by mouth daily.    Marland Kitchen lisinopril-hydrochlorothiazide (PRINZIDE,ZESTORETIC) 20-12.5 MG per tablet Take 1 tablet by mouth daily.    . metoprolol succinate (TOPROL-XL) 100 MG 24 hr tablet Take 1 tablet by mouth daily.    . Omega-3 Fatty Acids (FISH OIL) 1200 MG CAPS Take 1 capsule by mouth daily.    . vitamin B-12 (CYANOCOBALAMIN) 500 MCG tablet Take 500 mcg by mouth daily.  No current facility-administered medications for this visit.    Review of Systems Review of Systems  Constitutional: Negative.   Respiratory: Negative.   Cardiovascular: Negative.     Blood pressure 148/68, pulse 68, resp. rate 16, height 5' 7"  (1.702 m), weight 136 lb (61.689 kg).  Physical Exam Physical Exam  Constitutional: She is oriented to person, place, and time. She appears well-developed and well-nourished.  Eyes: Conjunctivae are normal. No scleral icterus.  Neck: Neck supple.  Cardiovascular: Normal rate, regular rhythm and normal heart sounds.   Mild swelling on the left leg and right leg moderate swelling.  Pulmonary/Chest: Effort normal and breath sounds normal. Right breast exhibits no inverted nipple, no mass, no nipple discharge, no skin  change and no tenderness. Left breast exhibits no inverted nipple, no mass, no nipple discharge, no skin change and no tenderness.    Right breast well healed lower inner quadrant.   Musculoskeletal:       Arms: Lymphadenopathy:    She has no cervical adenopathy.    She has no axillary adenopathy.       Right: No supraclavicular adenopathy present.       Left: No supraclavicular adenopathy present.  Neurological: She is alert and oriented to person, place, and time.  Skin: Skin is warm and dry.  Left hand amputation site is well healed.    Data Reviewed Bilateral screening mammograms dated 06/12/2015 were reviewed. No interval change. BI-RADS-1.  Assessment    No evidence of recurrent    No evidence of recurrent squamous cell carcinoma of the left hand. Plan         Patient will be asked to return to the office in one year with a bilateral screening mammogram. PCP:  Clint Bolder 06/20/2015, 11:02 AM

## 2015-09-05 ENCOUNTER — Ambulatory Visit: Payer: Medicare Other | Admitting: Obstetrics and Gynecology

## 2015-09-26 ENCOUNTER — Ambulatory Visit: Payer: Medicare Other | Admitting: Obstetrics and Gynecology

## 2015-10-02 ENCOUNTER — Encounter: Payer: Self-pay | Admitting: Obstetrics and Gynecology

## 2015-10-02 ENCOUNTER — Ambulatory Visit (INDEPENDENT_AMBULATORY_CARE_PROVIDER_SITE_OTHER): Payer: Medicare Other | Admitting: Obstetrics and Gynecology

## 2015-10-02 VITALS — BP 160/94 | HR 108 | Ht 69.0 in | Wt 144.0 lb

## 2015-10-02 DIAGNOSIS — R300 Dysuria: Secondary | ICD-10-CM | POA: Diagnosis not present

## 2015-10-02 DIAGNOSIS — R3129 Other microscopic hematuria: Secondary | ICD-10-CM | POA: Diagnosis not present

## 2015-10-02 DIAGNOSIS — N390 Urinary tract infection, site not specified: Secondary | ICD-10-CM

## 2015-10-02 LAB — URINALYSIS, COMPLETE
BILIRUBIN UA: NEGATIVE
GLUCOSE, UA: NEGATIVE
Ketones, UA: NEGATIVE
Leukocytes, UA: NEGATIVE
NITRITE UA: NEGATIVE
PROTEIN UA: NEGATIVE
Specific Gravity, UA: 1.015 (ref 1.005–1.030)
UUROB: 0.2 mg/dL (ref 0.2–1.0)
pH, UA: 5 (ref 5.0–7.5)

## 2015-10-02 LAB — MICROSCOPIC EXAMINATION

## 2015-10-02 NOTE — Progress Notes (Signed)
In and Out Catheterization  Patient is present today for a I & O catheterization due to recurrent UTI. Patient was cleaned and prepped in a sterile fashion with betadine.  A 14 FR cath was inserted no complications were noted , 8 ml of urine return was noted, urine was yellow in color. A clean urine sample was collected for UA . Bladder was drained  And catheter was removed with out difficulty.    Preformed by:K.russell,CMA

## 2015-10-02 NOTE — Progress Notes (Signed)
11:13 AM   Emily Moyer [birth year] [id number]  Referring provider: Ezequiel Kayser, MD [facility] [place], [place] [address]  Chief Complaint  Patient presents with  . Recurrent UTI    HPI: Patient is an 82 year old female with a history of recurrent urinary tract infections presenting today with complaints of dysuria. She is previously been seen by PA Zara Council. He was instructed to use vaginal estrogen cream to improve vaginal atrophy but daughter states that the pharmacy they went to did not carry medication and that she was given a pill? Patient reports no issues until approximately about a week ago when she began to experience dysuria. She denies fevers, flank pain, frequency or gross hematuria. She does state that she experiences nocturia 1-3 times per night but also has significant lower extremity edema. Daughter states patient does have a history of dementia and is a poor historian.    Current Status: Patient presents today for recheck and to provide a catheterized urine specimen for confirmation of resolution of previous urinary tract infection. Urine culture last visit was positive to 50,000-100,000 Klebsiella pneumoniae. She was treated with Keflex. He denies any urinary symptoms today. No fevers, dysuria, nausea or flank pain.   PMH: Past Medical History  Diagnosis Date  . Benign neoplasm of skin of upper limb, including shoulder   . Hypertension   . Arthritis   . Stroke ([place])   . Neuropathy (HCC)     legs  . Colon polyp   . Malignant neoplasm of lower-inner quadrant of female breast ([place])     11/03/2002: T1 C., N0 invasive mammary carcinoma, ER-positive, HER-2/neu not overexpressing. Treated with wide excision and sentinel node biopsy.  . Malignant neoplasm of upper limb (HCC)     left thumb, T1, N0 treated by partial thumb amputation, sentinel node biopsy.  . Breast cancer ([place]) 12/02/02    lumpectomy/rad-positive  .  Skin cancer   . Benign essential HTN 03/18/2014  . B12 deficiency 03/18/2014  . 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 Peripheral nerve disease ([place]) 03/18/2014  . SCC (squamous cell carcinoma), arm 05/05/2013    Overview:  Overview:  Left     Surgical History: Past Surgical History  Procedure Laterality Date  . Cholecystectomy    . Abdominal hysterectomy    . 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 Colonoscopy  2008  . Biopsy thumb Left 2013     0.4 x 1.8 x 1.8 cm histologic grade 2 invasive squamous cell. 7 mm from in situ carcinoma to resection, 1.2 cm from invasive cancer. Negative margins.pT1,N0  . Colonoscopy w/ polypectomy  06/23/2006 transverse colon tubular adenoma without high-grade dysplasia.  . Breast lumpectomy Right 2004  . Breast biopsy Right 10/23/02    positive    Home Medications:    Medication List       This list is accurate as of: 10/02/15 11:13 AM.  Always use your most recent med list.               amLODipine 10 MG tablet  Commonly known as:  NORVASC     aspirin 81 MG tablet  Take 81 mg by mouth daily.     atorvastatin 40 MG tablet  Commonly known as:  LIPITOR  Take 1 tablet by mouth daily.     cephALEXin 500 MG capsule  Commonly known as:  KEFLEX  Take 1 capsule (500 mg total) by mouth 2 (  two) times daily.     ciprofloxacin 250 MG tablet  Commonly known as:  CIPRO  Take 1 tablet by mouth daily. Reported on 10/02/2015     felodipine 5 MG 24 hr tablet  Commonly known as:  PLENDIL  Take 1 tablet by mouth daily.     Fish Oil 1200 MG Caps  Take 1 capsule by mouth daily.     lisinopril-hydrochlorothiazide 20-12.5 MG tablet  Commonly known as:  PRINZIDE,ZESTORETIC  Take 1 tablet by mouth daily.     metoprolol succinate 100 MG 24 hr tablet  Commonly known as:  TOPROL-XL  Take 1 tablet by mouth daily.     vitamin B-12 500 MCG tablet  Commonly known as:  CYANOCOBALAMIN  Take 500 mcg by mouth daily.     Vitamin D-3  1000 units Caps  Take 1 capsule by mouth daily.        Allergies:  Allergies  Allergen Reactions  . Aspirin-Dipyridamole Er     Other reaction(s): Unknown  . Dipyridamole     Other reaction(s): Unknown  . Clonidine Other (See Comments)    Dry mouth  . Cymbalta  [Duloxetine Hcl]     Other reaction(s): Other (See Comments) insomnia  . Duloxetine     Other reaction(s): Other (See Comments) insomnia    Family History: Family History  Problem Relation Age of Onset  . Family history unknown: Yes    Social History:  reports that she has never smoked. Her smokeless tobacco use includes Snuff. She reports that she does not drink alcohol or use illicit drugs.  ROS: UROLOGY Frequent Urination?: No Hard to postpone urination?: No Burning/pain with urination?: No Get up at night to urinate?: No Leakage of urine?: No Urine stream starts and stops?: No Trouble starting stream?: No Do you have to strain to urinate?: No Blood in urine?: No Urinary tract infection?: No Sexually transmitted disease?: No Injury to kidneys or bladder?: No Painful intercourse?: No Weak stream?: No Currently pregnant?: No Vaginal bleeding?: No Last menstrual period?: n  Gastrointestinal Nausea?: No Vomiting?: No Indigestion/heartburn?: No Diarrhea?: No Constipation?: No  Constitutional Fever: No Night sweats?: No Weight loss?: No Fatigue?: No  Skin Skin rash/lesions?: No Itching?: No  Eyes Blurred vision?: No Double vision?: No  Ears/Nose/Throat Sore throat?: No Sinus problems?: No  Hematologic/Lymphatic Swollen glands?: No Easy bruising?: No  Cardiovascular Leg swelling?: Yes Chest pain?: No  Respiratory Cough?: No Shortness of breath?: No  Endocrine Excessive thirst?: No  Musculoskeletal Back pain?: Yes Joint pain?: No  Neurological Headaches?: No Dizziness?: No  Psychologic Depression?: No Anxiety?: No  Physical Exam: BP 160/94 mmHg  Pulse 108  Ht  _0  (1.753 m)  Wt 144 lb (65.318 kg)  BMI 21.26 kg/m2  Constitutional:  Alert and oriented, No acute distress. HEENT: [place] AT, moist mucus membranes.  Trachea midline, no masses. Cardiovascular: No clubbing, cyanosis, or edema. Respiratory: Normal respiratory effort, no increased work of breathing.  Skin: No rashes, bruises or suspicious lesions. Neurologic: Grossly intact, no focal deficits, moving all 4 extremities. Psychiatric: Normal mood and affect.  Laboratory Data: No results found for: WBC, HGB, HCT, MCV, PLT  No results found for: CREATININE  No results found for: PSA  No results found for: TESTOSTERONE  No results found for: HGBA1C  Urinalysis    Component Value Date/Time   COLORURINE Straw 04/12/2012 1058   APPEARANCEUR Clear 04/12/2012 1058   LABSPEC 1.008 04/12/2012 1058   PHURINE 5.0 04/12/2012 1058  GLUCOSEU Negative 06/06/2015 1101   GLUCOSEU Negative 04/12/2012 1058   HGBUR Negative 04/12/2012 1058   BILIRUBINUR Negative 06/06/2015 1101   BILIRUBINUR Negative 04/12/2012 1058   KETONESUR Negative 04/12/2012 1058   PROTEINUR Negative 04/12/2012 1058   NITRITE Negative 06/06/2015 1101   NITRITE Negative 04/12/2012 1058   LEUKOCYTESUR 2+* 06/06/2015 1101   LEUKOCYTESUR Negative 04/12/2012 1058    Pertinent Imaging:  Assessment & Plan:   1. Dysuria- Resolved. Patient denies any dysuria today. No fevers or flank pain. Patient has not used estrogen cream or vaginal moisturizers as previously recommended. - Urinalysis, Complete - BLADDER SCAN AMB NON-IMAGING  2. Microscopic hematuria-  Asymptomatic.  Catheterized urine specimen was obtained but there was insufficient amount to be sent for culture.  Return in 1-2 weeks for recheck UA and culture. We discussed the differential diagnosis for microscopic hematuria including nephrolithiasis, renal or upper tract tumors, bladder stones, UTIs, or bladder tumors as well as undetermined etiologies. Per AUA  guidelines, I did recommend complete microscopic hematuria evaluation including CTU, possible urine cytology, and office cystoscopy. Patient and family state understanding and would like to defer work up at this time.  3. Recurrent UTI  -Will call with UA/culture results. Patient's daughter says it's okay to leave messages on the answering machine.  Return for 1-2 weeks for cath specimen UA/culture nurse schedule.  Herbert Moors, [facility] [address], [place], [place] [address] [phone]

## 2015-10-16 ENCOUNTER — Ambulatory Visit: Payer: Medicare Other

## 2016-04-16 ENCOUNTER — Other Ambulatory Visit: Payer: Self-pay | Admitting: General Surgery

## 2016-04-16 DIAGNOSIS — Z1231 Encounter for screening mammogram for malignant neoplasm of breast: Secondary | ICD-10-CM

## 2016-04-29 ENCOUNTER — Encounter: Payer: Self-pay | Admitting: *Deleted

## 2016-06-12 ENCOUNTER — Ambulatory Visit: Admission: RE | Admit: 2016-06-12 | Payer: Medicare Other | Source: Ambulatory Visit

## 2016-06-16 ENCOUNTER — Other Ambulatory Visit: Payer: Self-pay | Admitting: General Surgery

## 2016-06-16 ENCOUNTER — Ambulatory Visit
Admission: RE | Admit: 2016-06-16 | Discharge: 2016-06-16 | Disposition: A | Discharge status: Home / Self Care | Payer: Medicare Other | Source: Ambulatory Visit | Attending: General Surgery | Admitting: General Surgery

## 2016-06-16 DIAGNOSIS — Z1231 Encounter for screening mammogram for malignant neoplasm of breast: Secondary | ICD-10-CM

## 2016-06-19 ENCOUNTER — Ambulatory Visit: Payer: Medicare Other | Admitting: General Surgery

## 2016-06-23 ENCOUNTER — Encounter: Payer: Self-pay | Admitting: General Surgery

## 2016-06-23 ENCOUNTER — Ambulatory Visit (INDEPENDENT_AMBULATORY_CARE_PROVIDER_SITE_OTHER): Payer: Medicare Other | Admitting: General Surgery

## 2016-06-23 VITALS — BP 128/70 | HR 68 | Resp 16 | Ht 67.0 in | Wt 149.0 lb

## 2016-06-23 DIAGNOSIS — Z853 Personal history of malignant neoplasm of breast: Secondary | ICD-10-CM

## 2016-06-23 DIAGNOSIS — R222 Localized swelling, mass and lump, trunk: Secondary | ICD-10-CM | POA: Diagnosis not present

## 2016-06-23 NOTE — Patient Instructions (Signed)
Return in one year breast check

## 2016-06-23 NOTE — Progress Notes (Signed)
Patient ID: Emily Moyer, female   DOB: 08-Dec-1923, 82 y.o.   MRN: [id number]  Chief Complaint  Patient presents with  . Follow-up    mammogram    HPI Lavida Colbi Schiltz is a 82 y.o. female who presents for a breast evaluation. The most recent mammogram was done on 06/12/16.  Patient does perform regular self breast checks and gets regular mammograms done.  Daughter [name] present at visit.   HPI  Past Medical History:  Diagnosis Date  . Arthritis   . B12 deficiency 03/18/2014  . Benign essential HTN 03/18/2014  . Benign neoplasm of skin of upper limb, including shoulder   . Breast cancer ([place]) 12/02/02   lumpectomy/rad-positive  . 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 Colon polyp   . Hypertension   . Malignant neoplasm of lower-inner quadrant of female breast ([place])    11/03/2002: T1 C., N0 invasive mammary carcinoma, ER-positive, HER-2/neu not overexpressing. Treated with wide excision and sentinel node biopsy.  . Malignant neoplasm of upper limb (HCC)    left thumb, T1, N0 treated by partial thumb amputation, sentinel node biopsy.  Marland Kitchen Neuropathy (HCC)    legs  . Peripheral nerve disease ([place]) 03/18/2014  . SCC (squamous cell carcinoma), arm 05/05/2013   Overview:  Overview:  Left   . Skin cancer   . Stroke [facility])     Past Surgical History:  Procedure Laterality Date  . ABDOMINAL HYSTERECTOMY    . biopsy thumb Left 2013    0.4 x 1.8 x 1.8 cm histologic grade 2 invasive squamous cell. 7 mm from in situ carcinoma to resection, 1.2 cm from invasive cancer. Negative margins.pT1,N0  . BREAST BIOPSY Right 10/23/02   positive  . BREAST LUMPECTOMY Right 2004  . CHOLECYSTECTOMY    . COLONOSCOPY  2008  . COLONOSCOPY W/ POLYPECTOMY  06/23/2006 transverse colon tubular adenoma without high-grade dysplasia.  Marland Kitchen THUMB AMPUTATION Left 04/19/2012   Squamous cell carcinoma    Family History  Problem Relation Age of Onset  . Breast cancer Neg Hx     Social History Social  History  Substance Use Topics  . Smoking status: Never Smoker  . Smokeless tobacco: Current User    Types: Snuff     Comment: used 35 years  . Alcohol use No    Allergies  Allergen Reactions  . Aspirin-Dipyridamole Er     Other reaction(s): Unknown  . Dipyridamole     Other reaction(s): Unknown  . Clonidine Other (See Comments)    Dry mouth  . Cymbalta  [Duloxetine Hcl]     Other reaction(s): Other (See Comments) insomnia  . Duloxetine     Other reaction(s): Other (See Comments) insomnia    Current Outpatient Prescriptions  Medication Sig Dispense Refill  . amLODipine (NORVASC) 10 MG tablet     . aspirin 81 MG tablet Take 81 mg by mouth daily.    Marland Kitchen atorvastatin (LIPITOR) 40 MG tablet Take 1 tablet by mouth daily.    . cephALEXin (KEFLEX) 500 MG capsule Take 1 capsule (500 mg total) by mouth 2 (two) times daily. 14 capsule 0  . Cholecalciferol (VITAMIN D-3) 1000 UNITS CAPS Take 1 capsule by mouth daily.    . ciprofloxacin (CIPRO) 250 MG tablet Take 1 tablet by mouth daily. Reported on 10/02/2015    . felodipine (PLENDIL) 5 MG 24 hr tablet Take 1 tablet by mouth daily.    Marland Kitchen lisinopril-hydrochlorothiazide (PRINZIDE,ZESTORETIC) 20-12.5 MG per tablet Take 1 tablet by mouth daily.    Marland Kitchen  metoprolol succinate (TOPROL-XL) 100 MG 24 hr tablet Take 1 tablet by mouth daily.    . Omega-3 Fatty Acids (FISH OIL) 1200 MG CAPS Take 1 capsule by mouth daily.    . vitamin B-12 (CYANOCOBALAMIN) 500 MCG tablet Take 500 mcg by mouth daily.     No current facility-administered medications for this visit.     Review of Systems Review of Systems  Constitutional: Negative.   Respiratory: Negative.   Cardiovascular: Negative.     Blood pressure 128/70, pulse 68, resp. rate 16, height _0  (1.702 m), weight 149 lb (67.6 kg).  Physical Exam Physical Exam  Constitutional: She is oriented to person, place, and time. She appears well-developed and well-nourished.  Eyes: Conjunctivae are normal.  No scleral icterus.  Neck: Neck supple.  Cardiovascular: Normal rate, regular rhythm and normal heart sounds.   Pulmonary/Chest: Effort normal. Right breast exhibits no inverted nipple, no mass, no nipple discharge, no skin change and no tenderness. Left breast exhibits no inverted nipple, no mass, no nipple discharge, no skin change and no tenderness.    Abdominal: Soft. Bowel sounds are normal. There is no tenderness.  Lymphadenopathy:    She has no cervical adenopathy.    She has no axillary adenopathy.  Neurological: She is alert and oriented to person, place, and time.  Skin: Skin is warm and dry.    Data Reviewed Bilateral screening mammograms dated 06/17/2016 were reviewed. BI-RADS-1.  Assessment    Benign breast exam.  No evidence of recurrence of the cancer involving the left thumb.  New chest wall lesion suspicious for squamous cell carcinoma.    Plan    The patient was amenable to have the chest wall lesion removed. 10 mL of 0.5% Xylocaine with 0.25% Marcaine with 1-200,000 epinephrine was utilized well tolerated. ChloraPrep was applied to the skin. The area was excised through elliptical incision with 2 mm borders. A suture was placed to the 12:00 position. The wound was closed with a running 4-0 Vicryl subcuticular suture. Benzoin and Steri-Strips followed by Telfa and Tegaderm dressing was applied.  The procedure was well tolerated.  The outer bandage will be removed in 3 days. If she has any concerns regarding wound healing. She will be contacted when pathology is available.   Return in one year breast check  This information has been scribed by Gaspar Cola CMA. Robert Bellow 06/24/2016, 4:55 PM

## 2016-06-24 DIAGNOSIS — R222 Localized swelling, mass and lump, trunk: Secondary | ICD-10-CM | POA: Insufficient documentation

## 2016-06-27 ENCOUNTER — Telehealth: Payer: Self-pay

## 2016-06-27 NOTE — Telephone Encounter (Signed)
-----   Message from Robert Bellow, MD sent at 06/26/2016  6:21 PM EDT ----- Please notify patient and daughter that the chest wall lesion was an early skin cancer.  All clear.  ----- Message ----- From: Interface, Lab In [facility] [place] [facility] Sent: 06/26/2016   4:53 PM To: Robert Bellow, MD

## 2016-07-01 NOTE — Telephone Encounter (Signed)
Notified patient daughter as instructed, patient daughter pleased. Discussed follow-up appointments, patient daughter agrees  

## 2016-07-07 ENCOUNTER — Ambulatory Visit
Admission: EM | Admit: 2016-07-07 | Discharge: 2016-07-07 | Disposition: A | Discharge status: Home / Self Care | Payer: Medicare Other | Attending: Family Medicine | Admitting: Family Medicine

## 2016-07-07 DIAGNOSIS — R300 Dysuria: Secondary | ICD-10-CM | POA: Diagnosis present

## 2016-07-07 DIAGNOSIS — N390 Urinary tract infection, site not specified: Secondary | ICD-10-CM | POA: Insufficient documentation

## 2016-07-07 LAB — URINALYSIS COMPLETE WITH MICROSCOPIC (ARMC ONLY)
Bilirubin Urine: NEGATIVE
Glucose, UA: NEGATIVE mg/dL
Ketones, ur: NEGATIVE mg/dL
Nitrite: NEGATIVE
PH: 5.5 (ref 5.0–8.0)
PROTEIN: 30 mg/dL — AB
SPECIFIC GRAVITY, URINE: 1.015 (ref 1.005–1.030)

## 2016-07-07 MED ORDER — NITROFURANTOIN MONOHYD MACRO 100 MG PO CAPS: 100.0000 mg | ORAL_CAPSULE | Freq: Two times a day (BID) | ORAL | 0 refills | Status: DC

## 2016-07-07 NOTE — ED Triage Notes (Signed)
Patient c/o back pain, burns with urination.

## 2016-07-07 NOTE — ED Provider Notes (Signed)
CSN: [id number]     Arrival date & time 07/07/16  1611 History   None    Chief Complaint  Patient presents with  . Urinary Tract Infection   (Consider location/radiation/quality/duration/timing/severity/associated sxs/prior Treatment) HPI  This a 82 year old female who presents with  back pain dysuria described as burning with frequency and urgency for the last 2 days. No fever. She's had recurrent UTIs in the past and she states that this is similar.      Past Medical History:  Diagnosis Date  . Arthritis   . B12 deficiency 03/18/2014  . Benign essential HTN 03/18/2014  . Benign neoplasm of skin of upper limb, including shoulder   . Breast cancer ([place]) 12/02/02   lumpectomy/rad-positive  . 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 Colon polyp   . Hypertension   . Malignant neoplasm of lower-inner quadrant of female breast ([place])    11/03/2002: T1 C., N0 invasive mammary carcinoma, ER-positive, HER-2/neu not overexpressing. Treated with wide excision and sentinel node biopsy.  . Malignant neoplasm of upper limb (HCC)    left thumb, T1, N0 treated by partial thumb amputation, sentinel node biopsy.  Marland Kitchen Neuropathy (HCC)    legs  . Peripheral nerve disease ([place]) 03/18/2014  . SCC (squamous cell carcinoma), arm 05/05/2013   Overview:  Overview:  Left   . Skin cancer   . Stroke [facility])    Past Surgical History:  Procedure Laterality Date  . ABDOMINAL HYSTERECTOMY    . biopsy thumb Left 2013    0.4 x 1.8 x 1.8 cm histologic grade 2 invasive squamous cell. 7 mm from in situ carcinoma to resection, 1.2 cm from invasive cancer. Negative margins.pT1,N0  . BREAST BIOPSY Right 10/23/02   positive  . BREAST LUMPECTOMY Right 2004  . CHOLECYSTECTOMY    . COLONOSCOPY  2008  . COLONOSCOPY W/ POLYPECTOMY  06/23/2006 transverse colon tubular adenoma without high-grade dysplasia.  Marland Kitchen THUMB AMPUTATION Left 04/19/2012   Squamous cell carcinoma   Family History  Problem Relation Age of Onset  . Breast cancer  Neg Hx    Social History  Substance Use Topics  . Smoking status: Never Smoker  . Smokeless tobacco: Current User    Types: Snuff     Comment: used 35 years  . Alcohol use No   OB History    Gravida Para Term Preterm AB Living   _0 SAB TAB Ectopic Multiple Live Births                  Obstetric Comments   Menstrual age: 78  Age 1st Pregnancy: 32     Review of Systems  Constitutional: Negative for appetite change, chills, fatigue and fever.  Genitourinary: Positive for dysuria, frequency and urgency.  All other systems reviewed and are negative.   Allergies  Aspirin-dipyridamole er; Dipyridamole; Clonidine; Cymbalta  [duloxetine hcl]; and Duloxetine  Home Medications   Prior to Admission medications   Medication Sig Start Date End Date Taking? Authorizing Provider  amLODipine (NORVASC) 10 MG tablet  06/10/14   Historical Provider, MD  aspirin 81 MG tablet Take 81 mg by mouth daily.    Historical Provider, MD  atorvastatin (LIPITOR) 40 MG tablet Take 1 tablet by mouth daily. 04/16/13   Historical Provider, MD  cephALEXin (KEFLEX) 500 MG capsule Take 1 capsule (500 mg total) by mouth 2 (two) times daily. 06/06/15   Roda Shutters, FNP  Cholecalciferol (VITAMIN D-3) 1000 UNITS  CAPS Take 1 capsule by mouth daily.    Historical Provider, MD  ciprofloxacin (CIPRO) 250 MG tablet Take 1 tablet by mouth daily. Reported on 10/02/2015 07/11/13   Historical Provider, MD  felodipine (PLENDIL) 5 MG 24 hr tablet Take 1 tablet by mouth daily. 04/09/13   Historical Provider, MD  lisinopril-hydrochlorothiazide (PRINZIDE,ZESTORETIC) 20-12.5 MG per tablet Take 1 tablet by mouth daily. 04/16/13   Historical Provider, MD  metoprolol succinate (TOPROL-XL) 100 MG 24 hr tablet Take 1 tablet by mouth daily. 04/30/13   Historical Provider, MD  nitrofurantoin, macrocrystal-monohydrate, (MACROBID) 100 MG capsule Take 1 capsule (100 mg total) by mouth 2 (two) times daily. 07/07/16   Lorin Picket, PA-C  Omega-3 Fatty Acids (FISH OIL) 1200 MG CAPS Take 1 capsule by mouth daily.    Historical Provider, MD  vitamin B-12 (CYANOCOBALAMIN) 500 MCG tablet Take 500 mcg by mouth daily.    Historical Provider, MD   Meds Ordered and Administered this Visit  Medications - No data to display  BP (!) 158/66 (BP Location: Left Arm)   Pulse (!) 108   Temp 98.5 F (36.9 C) (Oral)   Resp 18   Ht 5' 7" (1.702 m)   Wt 149 lb (67.6 kg)   SpO2 98%   BMI 23.34 kg/m  No data found.   Physical Exam  Constitutional: She is oriented to person, place, and time. She appears well-developed and well-nourished. No distress.  HENT:  Head: Normocephalic and atraumatic.  Eyes: EOM are normal. Pupils are equal, round, and reactive to light.  Neck: Normal range of motion. Neck supple.  Abdominal: Soft. Bowel sounds are normal. She exhibits no distension and no mass. There is no tenderness. There is no rebound and no guarding.  Musculoskeletal: Normal range of motion. She exhibits no tenderness or deformity.  Neurological: She is alert and oriented to person, place, and time.  Skin: Skin is warm and dry. She is not diaphoretic.  Psychiatric: She has a normal mood and affect. Her behavior is normal. Judgment and thought content normal.  Nursing note and vitals reviewed.   Urgent Care Course   Clinical Course    Procedures (including critical care time)  Labs Review Labs Reviewed  URINALYSIS COMPLETEWITH MICROSCOPIC ([place] ONLY) - Abnormal; Notable for the following:       Result Value   APPearance HAZY (*)    Hgb urine dipstick SMALL (*)    Protein, ur 30 (*)    Leukocytes, UA LARGE (*)    Bacteria, UA FEW (*)    Squamous Epithelial / LPF 0-5 (*)    All other components within normal limits  URINE CULTURE    Imaging Review No results found.   Visual Acuity Review  Right Eye Distance:   Left Eye Distance:   Bilateral Distance:    Right Eye Near:   Left Eye Near:    Bilateral  Near:         MDM   1. Lower urinary tract infectious disease    Discharge Medication List as of 07/07/2016  5:26 PM    START taking these medications   Details  nitrofurantoin, macrocrystal-monohydrate, (MACROBID) 100 MG capsule Take 1 capsule (100 mg total) by mouth 2 (two) times daily., Starting Mon 07/07/2016, Normal      Plan: 1. Test/x-ray results and diagnosis reviewed with patient 2. rx as per orders; risks, benefits, potential side effects reviewed with patient 3. Recommend supportive treatment with Increased fluids. Urine cultures will  be ready in 48 hours. If she worsens or runs a high fever has severe back pain she needs to go immediately to emergency room. She'll follow-up with her primary care physician in 1-2 weeks. 4. F/u prn if symptoms worsen or don't improve     Lorin Picket, PA-C 07/07/16 1738

## 2016-07-10 ENCOUNTER — Telehealth: Payer: Self-pay | Admitting: Emergency Medicine

## 2016-07-10 LAB — URINE CULTURE: Culture: >=100000 — AB

## 2016-07-10 NOTE — Telephone Encounter (Signed)
Left message for patient to call us back regarding her urine culture result and also if she is not feeling better.

## 2016-10-19 ENCOUNTER — Encounter: Payer: Self-pay | Admitting: Emergency Medicine

## 2016-10-19 ENCOUNTER — Emergency Department
Admission: EM | Admit: 2016-10-19 | Discharge: 2016-10-19 | Disposition: A | Discharge status: Home / Self Care | Payer: Medicare Other | Attending: Emergency Medicine | Admitting: Emergency Medicine

## 2016-10-19 ENCOUNTER — Emergency Department: Payer: Medicare Other

## 2016-10-19 DIAGNOSIS — Y92009 Unspecified place in unspecified non-institutional (private) residence as the place of occurrence of the external cause: Secondary | ICD-10-CM | POA: Diagnosis not present

## 2016-10-19 DIAGNOSIS — W109XXA Fall (on) (from) unspecified stairs and steps, initial encounter: Secondary | ICD-10-CM | POA: Insufficient documentation

## 2016-10-19 DIAGNOSIS — S9031XA Contusion of right foot, initial encounter: Secondary | ICD-10-CM

## 2016-10-19 DIAGNOSIS — Z7982 Long term (current) use of aspirin: Secondary | ICD-10-CM | POA: Insufficient documentation

## 2016-10-19 DIAGNOSIS — F17290 Nicotine dependence, other tobacco product, uncomplicated: Secondary | ICD-10-CM | POA: Insufficient documentation

## 2016-10-19 DIAGNOSIS — Y999 Unspecified external cause status: Secondary | ICD-10-CM | POA: Insufficient documentation

## 2016-10-19 DIAGNOSIS — N183 Chronic kidney disease, stage 3 (moderate): Secondary | ICD-10-CM | POA: Diagnosis not present

## 2016-10-19 DIAGNOSIS — Z79899 Other long term (current) drug therapy: Secondary | ICD-10-CM | POA: Insufficient documentation

## 2016-10-19 DIAGNOSIS — I129 Hypertensive chronic kidney disease with stage 1 through stage 4 chronic kidney disease, or unspecified chronic kidney disease: Secondary | ICD-10-CM | POA: Diagnosis not present

## 2016-10-19 DIAGNOSIS — S99921A Unspecified injury of right foot, initial encounter: Secondary | ICD-10-CM | POA: Diagnosis present

## 2016-10-19 DIAGNOSIS — Y9301 Activity, walking, marching and hiking: Secondary | ICD-10-CM | POA: Insufficient documentation

## 2016-10-19 MED ORDER — HYDROCODONE-ACETAMINOPHEN 5-325 MG PO TABS: 0.5000 | ORAL_TABLET | Freq: Four times a day (QID) | ORAL | 0 refills | Status: DC | PRN

## 2016-10-19 MED ORDER — ACETAMINOPHEN 500 MG PO TABS
1000.0000 mg | ORAL_TABLET | ORAL | Status: AC
  Administered 2016-10-19: 1000 mg via ORAL
  Filled 2016-10-19: qty 2

## 2016-10-19 MED ORDER — HYDROCODONE-ACETAMINOPHEN 5-325 MG PO TABS
0.5000 | ORAL_TABLET | Freq: Once | ORAL | Status: AC
  Administered 2016-10-19: 0.5 via ORAL
  Filled 2016-10-19: qty 1

## 2016-10-19 NOTE — ED Notes (Signed)
Pt discharged home after verbalizing understanding of discharge instructions; nad noted. 

## 2016-10-19 NOTE — ED Notes (Signed)
This RN to bedside, pt is noted to be asleep at this time. Pt's daughter at bedside as well. Will continue to monitor for further patient needs.

## 2016-10-19 NOTE — ED Provider Notes (Signed)
[facility] Emergency Department Provider Note   ____________________________________________   First MD Initiated Contact with Patient 10/19/16 1314     (approximate)  I have reviewed the triage vital signs and the nursing notes.   HISTORY  Chief Complaint Fall and Foot Pain    HPI Emily Moyer is a 83 y.o. female reports she was walking out of her home, she got to the bottom 2 steps and stumbled while holding onto the railing. She fell to the ground and describes sort of rolling her right ankle, and noticed bruising and some tenderness today that was making it difficult for her to walk because of pain over the heel of the right foot.  She did not strike her head. Had no preceding symptoms. Has not had any fevers or chills. No chest pain or recent illness. No trouble breathing. Denies pain or injury to the left leg. No pain in the right hip, no pain in the right ankle or in the knee, but reports pain is primarily at the heel of the right foot. No pain while sitting still, but reports when she tries to walk heel pain makes it difficult for her to walk  Patient does report she has slight swelling both ankles all the time, and she has a previous history of "neuropathy" they gives her slight tingling in both feet  Past Medical History:  Diagnosis Date  . Arthritis   . B12 deficiency 03/18/2014  . Benign essential HTN 03/18/2014  . Benign neoplasm of skin of upper limb, including shoulder   . Breast cancer ([place]) 12/02/02   lumpectomy/rad-positive  . 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 Colon polyp   . Hypertension   . Malignant neoplasm of lower-inner quadrant of female breast ([place])    11/03/2002: T1 C., N0 invasive mammary carcinoma, ER-positive, HER-2/neu not overexpressing. Treated with wide excision and sentinel node biopsy.  . Malignant neoplasm of upper limb (H[facility])    left thumb, T1, N0 treated by partial thumb amputation, sentinel node biopsy.  Marland Kitchen  Neuropathy (HCC)    legs  . Peripheral nerve disease ([place]) 03/18/2014  . SCC (squamous cell carcinoma), arm 05/05/2013   Overview:  Overview:  Left   . Skin cancer   . Stroke [facility])     Patient Active Problem List   Diagnosis Date Noted  . Nodule of chest wall 06/24/2016  . 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 Other long term (current) drug therapy 03/29/2015  . History of breast cancer 06/20/2014  . Chronic anemia 03/18/2014  . B12 deficiency 03/18/2014  . Chronic kidney disease (CKD), stage III (moderate) 03/18/2014  . Benign essential HTN 03/18/2014  . Chemical diabetes ([place]) 03/18/2014  . Combined fat and carbohydrate induced hyperlipemia 03/18/2014  . Peripheral nerve disease ([place]) 03/18/2014  . Abnormal presence of protein in urine 03/18/2014  . Avitaminosis D 03/18/2014  . Breast cancer of lower-inner quadrant of right female breast ([place]) 05/05/2013  . Squamous cell cancer of skin of thumb 05/05/2013  . Malignant neoplasm of lower-inner quadrant of female breast ([place]) 05/05/2013  . SCC (squamous cell carcinoma), arm 05/05/2013    Past Surgical History:  Procedure Laterality Date  . ABDOMINAL HYSTERECTOMY    . biopsy thumb Left 2013    0.4 x 1.8 x 1.8 cm histologic grade 2 invasive squamous cell. 7 mm from in situ carcinoma to resection, 1.2 cm from invasive cancer. Negative margins.pT1,N0  . BREAST BIOPSY Right  10/23/02   positive  . BREAST LUMPECTOMY Right 2004  . CHOLECYSTECTOMY    . COLONOSCOPY  2008  . COLONOSCOPY W/ POLYPECTOMY  06/23/2006 transverse colon tubular adenoma without high-grade dysplasia.  Marland Kitchen THUMB AMPUTATION Left 04/19/2012   Squamous cell carcinoma    Prior to Admission medications   Medication Sig Start Date End Date Taking? Authorizing Provider  amLODipine (NORVASC) 10 MG tablet  06/10/14   Historical Provider, MD  aspirin 81 MG tablet Take 81 mg by mouth daily.    Historical Provider, MD  atorvastatin (LIPITOR) 40  MG tablet Take 1 tablet by mouth daily. 04/16/13   Historical Provider, MD  cephALEXin (KEFLEX) 500 MG capsule Take 1 capsule (500 mg total) by mouth 2 (two) times daily. 06/06/15   Roda Shutters, FNP  Cholecalciferol (VITAMIN D-3) 1000 UNITS CAPS Take 1 capsule by mouth daily.    Historical Provider, MD  ciprofloxacin (CIPRO) 250 MG tablet Take 1 tablet by mouth daily. Reported on 10/02/2015 07/11/13   Historical Provider, MD  felodipine (PLENDIL) 5 MG 24 hr tablet Take 1 tablet by mouth daily. 04/09/13   Historical Provider, MD  HYDROcodone-acetaminophen (NORCO/VICODIN) 5-325 MG tablet Take 0.5 tablets by mouth every 6 (six) hours as needed for moderate pain. 10/19/16   Delman Kitten, MD  lisinopril-hydrochlorothiazide (PRINZIDE,ZESTORETIC) 20-12.5 MG per tablet Take 1 tablet by mouth daily. 04/16/13   Historical Provider, MD  metoprolol succinate (TOPROL-XL) 100 MG 24 hr tablet Take 1 tablet by mouth daily. 04/30/13   Historical Provider, MD  nitrofurantoin, macrocrystal-monohydrate, (MACROBID) 100 MG capsule Take 1 capsule (100 mg total) by mouth 2 (two) times daily. 07/07/16   Lorin Picket, PA-C  Omega-3 Fatty Acids (FISH OIL) 1200 MG CAPS Take 1 capsule by mouth daily.    Historical Provider, MD  vitamin B-12 (CYANOCOBALAMIN) 500 MCG tablet Take 500 mcg by mouth daily.    Historical Provider, MD    Allergies Aspirin-dipyridamole er; Dipyridamole; Clonidine; Cymbalta  [duloxetine hcl]; and Duloxetine  Family History  Problem Relation Age of Onset  . Breast cancer Neg Hx     Social History Social History  Substance Use Topics  . Smoking status: Never Smoker  . Smokeless tobacco: Current User    Types: Snuff     Comment: used 35 years  . Alcohol use No    Review of Systems Constitutional: No fever/chills Eyes: No visual changes. ENT: No sore throat. Cardiovascular: Denies chest pain. Respiratory: Denies shortness of breath. Gastrointestinal: No abdominal pain.  No nausea, no  vomiting.  No diarrhea.  No constipation. Genitourinary: Negative for dysuria. Musculoskeletal: Negative for back pain. Skin: Negative for rash. Neurological: Negative for headaches, focal weakness or numbness.  10-point ROS otherwise negative.  ____________________________________________   PHYSICAL EXAM:  VITAL SIGNS: ED Triage Vitals  Enc Vitals Group     BP 10/19/16 1307 131/70     Pulse Rate 10/19/16 1307 86     Resp 10/19/16 1307 18     Temp 10/19/16 1307 97.6 F (36.4 C)     Temp Source 10/19/16 1307 Oral     SpO2 10/19/16 1300 98 %     Weight 10/19/16 1308 153 lb (69.4 kg)     Height 10/19/16 1308 5' 9"  (1.753 m)     Head Circumference --      Peak Flow --      Pain Score 10/19/16 1309 6     Pain Loc --      Pain Edu? --  Excl. in [place]? --     Constitutional: Alert and oriented. Well appearing and in no acute distress.ery pleasant. Patient's daughter also present. Eyes: Conjunctivae are normal. PERRL. EOMI. Head: Atraumatic. Nose: No congestion/rhinnorhea. Mouth/Throat: Mucous membranes are moist.  Oropharynx non-erythematous. Neck: No stridor.   Cardiovascular: Normal rate, regular rhythm. Grossly normal heart sounds.  Good peripheral circulation. Respiratory: Normal respiratory effort.  No retractions. Lungs CTAB. Gastrointestinal: Soft and nontender. No distention.  Musculoskeletal:   Lower Extremities  No edema. Normal DP/PT pulses bilateral with good cap refill.  Normal neuro-motor function lower extremities bilateral.  RIGHT Right lower extremity demonstrates normal strength, good use of all muscles. No edema bruising or contusions of the right hip, right knee, right anklebut there is a small amount of ecchymoses noted over the heel of the right footwhere there is tenderness over the heel. Full range of motion of the right lower extremity. No pain on axial loading. No evidence of trauma.some slight stocking glove type neuropathy over the  toes  LEFT Left lower extremity demonstrates normal strength, good use of all muscles. No edema bruising or contusions of the hip,  knee, ankle. Full range of motion of the left lower extremity without pain. No pain on axial loading. No evidence of trauma.   Neurologic:  Normal speech and language. No gross focal neurologic deficits are appreciated. No gait instability. Skin:  Skin is warm, dry and intact. No rash noted. Psychiatric: Mood and affect are normal. Speech and behavior are normal.  ____________________________________________   LABS (all labs ordered are listed, but only abnormal results are displayed)  Labs Reviewed - No data to display ____________________________________________  EKG   ____________________________________________  RADIOLOGY  Dg Tibia/fibula Right  Result Date: 10/19/2016 CLINICAL DATA:  Fall yesterday.  Pain EXAM: RIGHT TIBIA AND FIBULA - 2 VIEW COMPARISON:  None. FINDINGS: There is no evidence of fracture or other focal bone lesions. Soft tissues are unremarkable. IMPRESSION: Negative. Electronically Signed   By: Franchot Gallo M.D.   On: 10/19/2016 13:46   Dg Ankle Complete Right  Result Date: 10/19/2016 CLINICAL DATA:  Fall yesterday.  Pain EXAM: RIGHT ANKLE - COMPLETE 3+ VIEW COMPARISON:  None. FINDINGS: Normal ankle joint. Negative for fracture or effusion. Calcaneal spurring. Osteopenia. IMPRESSION: Negative for fracture Electronically Signed   By: Franchot Gallo M.D.   On: 10/19/2016 13:45   Dg Foot Complete Right  Result Date: 10/19/2016 CLINICAL DATA:  Fall yesterday.  Pain EXAM: RIGHT FOOT COMPLETE - 3+ VIEW COMPARISON:  None. FINDINGS: Negative for fracture. Generalized osteopenia. Calcaneal spurring. No significant arthropathy. IMPRESSION: Negative for fracture. Electronically Signed   By: Franchot Gallo M.D.   On: 10/19/2016 13:45    ____________________________________________   PROCEDURES   Procedure(s) performed:  None  Procedures  Critical Care performed: No  ____________________________________________   INITIAL IMPRESSION / ASSESSMENT AND PLAN / ED COURSE  Pertinent labs & imaging results that were available during my care of the patient were reviewed by me and considered in my medical decision making (see chart for details).  Patient reports a fall, injuring her right foot only. Reports that she's had pain making it somewhat difficult for her to ambulate due to pain in the right heel where bruising is noted. She is neurovascularly intact without obvious deformity.   no associated systemic symptoms, no cardiac neurologic or pulmonary symptoms.    ----------------------------------------- 3:38 PM on 10/19/2016 -----------------------------------------  Patient resting comfortably. Daughter reports that they would like a prescription for stronger  pain medicine that she can use in the evenings, but she is been having some complaints of pain while resting and when she tries to get up on her own. Daughter is comfortable taking her home, I will prescribe her a half tablet of hydrocodone and discussed risks including possible lightheadedness, confusion that are associated with this medication. Daughter comfortable with plan for discharge as well as the patient. The daughter provides assistance for her and they will follow up with her primary care doctor. She has a walker at home already which she is able to use.  ____________________________________________   FINAL CLINICAL IMPRESSION(S) / ED DIAGNOSES  Final diagnoses:  Contusion of right heel, initial encounter      NEW MEDICATIONS STARTED DURING THIS VISIT:  New Prescriptions   HYDROCODONE-ACETAMINOPHEN (NORCO/VICODIN) 5-325 MG TABLET    Take 0.5 tablets by mouth every 6 (six) hours as needed for moderate pain.     Note:  This document was prepared using Dragon voice recognition software and may include unintentional dictation  errors.     Delman Kitten, MD 10/19/16 1539

## 2016-10-19 NOTE — ED Triage Notes (Addendum)
Pt presents to ED with c/o fall and foot pain. Per EMS pt fell yesterday down 2 stairs and fell face forward. EMS reports that patient hit her face after falling. Pt is noted to be disoriented to time, place, but is oriented to situation. Pt is noted to be alert at this time. When this RN asked patient the month again, pt states "I don't keep up with that". Pt states she remembers the fall but does not remember hitting her foot.

## 2016-10-19 NOTE — Discharge Instructions (Signed)
You have been seen in the Emergency Department (ED) today for a fall and bruising on your right heel.  Your work up does not show any concerning injuries.    DO NOT take more then a half tablet of prescribed hydrocodone every 6 hours.  Please follow up with your doctor regarding today's Emergency Department (ED) visit and your recent fall.    Return to the ED if you have any headache, confusion, slurred speech, weakness/numbness of any arm or leg, or any increased pain.

## 2016-10-19 NOTE — ED Notes (Signed)
MD notified that this RN unable to find pedal pulse with doppler. Pt taken to X-ray at this time.

## 2016-11-19 ENCOUNTER — Ambulatory Visit
Admission: EM | Admit: 2016-11-19 | Discharge: 2016-11-19 | Disposition: A | Discharge status: Home / Self Care | Payer: Medicare Other | Attending: Family Medicine | Admitting: Family Medicine

## 2016-11-19 DIAGNOSIS — R350 Frequency of micturition: Secondary | ICD-10-CM | POA: Diagnosis present

## 2016-11-19 DIAGNOSIS — N390 Urinary tract infection, site not specified: Secondary | ICD-10-CM

## 2016-11-19 LAB — URINALYSIS, COMPLETE (UACMP) WITH MICROSCOPIC
Bilirubin Urine: NEGATIVE
Glucose, UA: NEGATIVE mg/dL
Ketones, ur: NEGATIVE mg/dL
Nitrite: POSITIVE — AB
Protein, ur: NEGATIVE mg/dL
Specific Gravity, Urine: 1.01 (ref 1.005–1.030)
pH: 5.5 (ref 5.0–8.0)

## 2016-11-19 MED ORDER — NITROFURANTOIN MONOHYD MACRO 100 MG PO CAPS: 100.0000 mg | ORAL_CAPSULE | Freq: Two times a day (BID) | ORAL | 0 refills | Status: DC

## 2016-11-19 NOTE — ED Triage Notes (Signed)
Patient complains of urinary urgency, frequency, burning with urination, back pain, lower abdominal pain that started during the night last night.

## 2016-11-19 NOTE — ED Provider Notes (Signed)
[facility]    CSN: [id number] Arrival date & time: 11/19/16  0076     History   Chief Complaint Chief Complaint  Patient presents with  . Urinary Frequency    HPI Emily Moyer is a 83 y.o. female.   The history is provided by the patient.  Dysuria  Pain quality:  Burning Pain severity:  Mild Onset quality:  Sudden Duration:  1 day Timing:  Constant Progression:  Unchanged Chronicity:  New Recent urinary tract infections: no   Relieved by:  None tried Ineffective treatments:  None tried Urinary symptoms: frequent urination   Urinary symptoms: no discolored urine, no foul-smelling urine, no hematuria and no hesitancy   Associated symptoms: abdominal pain (suprapubic)   Associated symptoms: no fever, no flank pain, no genital lesions, no nausea, no vaginal discharge and no vomiting   Risk factors: no hx of pyelonephritis, no hx of urolithiasis, no kidney transplant, no renal cysts, no renal disease, not sexually active, no single kidney and no urinary catheter     Past Medical History:  Diagnosis Date  . Arthritis   . B12 deficiency 03/18/2014  . Benign essential HTN 03/18/2014  . Benign neoplasm of skin of upper limb, including shoulder   . Breast cancer ([place]) 12/02/02   lumpectomy/rad-positive  . 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 Colon polyp   . Hypertension   . Malignant neoplasm of lower-inner quadrant of female breast ([place])    11/03/2002: T1 C., N0 invasive mammary carcinoma, ER-positive, HER-2/neu not overexpressing. Treated with wide excision and sentinel node biopsy.  . Malignant neoplasm of upper limb (HCC)    left thumb, T1, N0 treated by partial thumb amputation, sentinel node biopsy.  Marland Kitchen Neuropathy (HCC)    legs  . Peripheral nerve disease ([place]) 03/18/2014  . SCC (squamous cell carcinoma), arm 05/05/2013   Overview:  Overview:  Left   . Skin cancer   . Stroke [facility])     Patient Active Problem List   Diagnosis Date Noted  . Nodule of  chest wall 06/24/2016  . 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 Other long term (current) drug therapy 03/29/2015  . History of breast cancer 06/20/2014  . Chronic anemia 03/18/2014  . B12 deficiency 03/18/2014  . Chronic kidney disease (CKD), stage III (moderate) 03/18/2014  . Benign essential HTN 03/18/2014  . 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 Combined fat and carbohydrate induced hyperlipemia 03/18/2014  . Peripheral nerve disease ([place]) 03/18/2014  . Abnormal presence of protein in urine 03/18/2014  . Avitaminosis D 03/18/2014  . Breast cancer of lower-inner quadrant of right female breast ([place]) 05/05/2013  . Squamous cell cancer of skin of thumb 05/05/2013  . Malignant neoplasm of lower-inner quadrant of female breast ([place]) 05/05/2013  . SCC (squamous cell carcinoma), arm 05/05/2013    Past Surgical History:  Procedure Laterality Date  . ABDOMINAL HYSTERECTOMY    . biopsy thumb Left 2013    0.4 x 1.8 x 1.8 cm histologic grade 2 invasive squamous cell. 7 mm from in situ carcinoma to resection, 1.2 cm from invasive cancer. Negative margins.pT1,N0  . BREAST BIOPSY Right 10/23/02   positive  . BREAST LUMPECTOMY Right 2004  . CHOLECYSTECTOMY    . COLONOSCOPY  2008  . COLONOSCOPY W/ POLYPECTOMY  06/23/2006 transverse colon tubular adenoma without high-grade dysplasia.  Marland Kitchen THUMB AMPUTATION Left 04/19/2012   Squamous cell carcinoma    OB History    Gravida Para  Term Preterm AB Living   1 1       1    SAB TAB Ectopic Multiple Live Births                  Obstetric Comments   Menstrual age: 57  Age 1st Pregnancy: 76       Home Medications    Prior to Admission medications   Medication Sig Start Date End Date Taking? Authorizing Provider  amLODipine (NORVASC) 10 MG tablet  06/10/14  Yes Historical Provider, MD  aspirin 81 MG tablet Take 81 mg by mouth daily.   Yes Historical Provider, MD  atorvastatin (LIPITOR) 40 MG tablet Take 1 tablet by  mouth daily. 04/16/13  Yes Historical Provider, MD  Cholecalciferol (VITAMIN D-3) 1000 UNITS CAPS Take 1 capsule by mouth daily.   Yes Historical Provider, MD  felodipine (PLENDIL) 5 MG 24 hr tablet Take 1 tablet by mouth daily. 04/09/13  Yes Historical Provider, MD  lisinopril-hydrochlorothiazide (PRINZIDE,ZESTORETIC) 20-12.5 MG per tablet Take 1 tablet by mouth daily. 04/16/13  Yes Historical Provider, MD  metoprolol succinate (TOPROL-XL) 100 MG 24 hr tablet Take 1 tablet by mouth daily. 04/30/13  Yes Historical Provider, MD  Omega-3 Fatty Acids (FISH OIL) 1200 MG CAPS Take 1 capsule by mouth daily.   Yes Historical Provider, MD  vitamin B-12 (CYANOCOBALAMIN) 500 MCG tablet Take 500 mcg by mouth daily.   Yes Historical Provider, MD  cephALEXin (KEFLEX) 500 MG capsule Take 1 capsule (500 mg total) by mouth 2 (two) times daily. 06/06/15   Roda Shutters, FNP  ciprofloxacin (CIPRO) 250 MG tablet Take 1 tablet by mouth daily. Reported on 10/02/2015 07/11/13   Historical Provider, MD  HYDROcodone-acetaminophen (NORCO/VICODIN) 5-325 MG tablet Take 0.5 tablets by mouth every 6 (six) hours as needed for moderate pain. 10/19/16   Delman Kitten, MD  nitrofurantoin, macrocrystal-monohydrate, (MACROBID) 100 MG capsule Take 1 capsule (100 mg total) by mouth 2 (two) times daily. 11/19/16   Norval Gable, MD    Family History Family History  Problem Relation Age of Onset  . Breast cancer Neg Hx     Social History Social History  Substance Use Topics  . Smoking status: Never Smoker  . Smokeless tobacco: Current User    Types: Snuff     Comment: used 35 years  . Alcohol use No     Allergies   Aspirin-dipyridamole er; Dipyridamole; Clonidine; Cymbalta  [duloxetine hcl]; and Duloxetine   Review of Systems Review of Systems  Constitutional: Negative for fever.  Gastrointestinal: Positive for abdominal pain (suprapubic). Negative for nausea and vomiting.  Genitourinary: Positive for dysuria. Negative for  flank pain and vaginal discharge.     Physical Exam Triage Vital Signs ED Triage Vitals  Enc Vitals Group     BP 11/19/16 0849 111/66     Pulse Rate 11/19/16 0849 94     Resp 11/19/16 0849 16     Temp 11/19/16 0849 98.1 F (36.7 C)     Temp Source 11/19/16 0849 Oral     SpO2 11/19/16 0849 99 %     Weight 11/19/16 0844 159 lb (72.1 kg)     Height --      Head Circumference --      Peak Flow --      Pain Score 11/19/16 0847 0     Pain Loc --      Pain Edu? --      Excl. in [place]? --    No  data found.   Updated Vital Signs BP 111/66 (BP Location: Left Arm)   Pulse 94   Temp 98.1 F (36.7 C) (Oral)   Resp 16   Wt 159 lb (72.1 kg)   SpO2 99%   BMI 23.48 kg/m   Visual Acuity Right Eye Distance:   Left Eye Distance:   Bilateral Distance:    Right Eye Near:   Left Eye Near:    Bilateral Near:     Physical Exam  Constitutional: She appears well-developed and well-nourished. No distress.  Abdominal: Soft. Bowel sounds are normal. She exhibits no distension and no mass. There is tenderness (mild, suprapubic). There is no rebound and no guarding.  Skin: She is not diaphoretic.  Nursing note and vitals reviewed.    UC Treatments / Results  Labs (all labs ordered are listed, but only abnormal results are displayed) Labs Reviewed  URINALYSIS, COMPLETE (UACMP) WITH MICROSCOPIC - Abnormal; Notable for the following:       Result Value   APPearance HAZY (*)    Hgb urine dipstick TRACE (*)    Nitrite POSITIVE (*)    Leukocytes, UA MODERATE (*)    Squamous Epithelial / LPF 0-5 (*)    Bacteria, UA MANY (*)    All other components within normal limits  URINE CULTURE    EKG  EKG Interpretation None       Radiology No results found.  Procedures Procedures (including critical care time)  Medications Ordered in UC Medications - No data to display   Initial Impression / Assessment and Plan / UC Course  I have reviewed the triage vital signs and the nursing  notes.  Pertinent labs & imaging results that were available during my care of the patient were reviewed by me and considered in my medical decision making (see chart for details).       Final Clinical Impressions(s) / UC Diagnoses   Final diagnoses:  Urinary tract infection without hematuria, site unspecified    New Prescriptions Discharge Medication List as of 11/19/2016  9:34 AM     1. diagnosis reviewed with patient 2. rx as per orders above; reviewed possible side effects, interactions, risks and benefits; rx for macrobid x 5 days (tolerated well in past)  3. Recommend supportive treatment with increased fluids 4. Follow-up prn if symptoms worsen or don't improve   Norval Gable, MD 11/19/16 [phone]

## 2016-11-21 LAB — URINE CULTURE: SPECIAL REQUESTS: NORMAL

## 2016-11-23 ENCOUNTER — Telehealth: Payer: Self-pay

## 2016-11-23 NOTE — Telephone Encounter (Signed)
Patient was informed of lab results from recent visit. Patient is improving and still taking antibiotic. Emily Moyer

## 2016-11-23 NOTE — Telephone Encounter (Signed)
-----   Message from Sherlene Shams, MD sent at 11/21/2016  8:08 PM EST ----- Urine culture positive E coli sensitive to nitrofurantoin rx given at [facility] visit 11/19/16.  Finish nitrofurantoin.  Recheck or followup with PCP for further evaluation if symptoms are not improving.  LM

## 2016-11-23 NOTE — Telephone Encounter (Signed)
Courtesy call back completed today for patients recent visit at [facility]. Patient did not answer, left message on voicemail to call back with any questions or concerns.   

## 2017-06-24 ENCOUNTER — Ambulatory Visit: Payer: Medicare Other | Admitting: General Surgery

## 2017-07-16 ENCOUNTER — Encounter: Payer: Self-pay | Admitting: General Surgery

## 2017-07-16 ENCOUNTER — Ambulatory Visit (INDEPENDENT_AMBULATORY_CARE_PROVIDER_SITE_OTHER): Payer: Medicare Other | Admitting: General Surgery

## 2017-07-16 VITALS — BP 118/64 | HR 84 | Resp 18 | Ht 69.0 in | Wt 156.0 lb

## 2017-07-16 DIAGNOSIS — Z853 Personal history of malignant neoplasm of breast: Secondary | ICD-10-CM | POA: Diagnosis not present

## 2017-07-16 NOTE — Patient Instructions (Signed)
The patient is aware to call back for any questions or concerns.  

## 2017-07-16 NOTE — Progress Notes (Signed)
Patient ID: Emily Moyer, female   DOB: 04-29-1924, 83 y.o.   MRN: [id number]  Chief Complaint  Patient presents with  . Follow-up    HPI Emily Moyer is a 83 y.o. female.  Here for her one year follow up right breast cancer and left thumb cancer. No new breast issues. She is taking Lyrica for neuropathy.  She is living with her daughter. She is here with her daughter, Emily Moyer.    HPI  Past Medical History:  Diagnosis Date  . Arthritis   . B12 deficiency 03/18/2014  . Benign essential HTN 03/18/2014  . Benign neoplasm of skin of upper limb, including shoulder   . Breast cancer ([place]) 12/02/02   lumpectomy/rad-positive  . 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 Colon polyp   . Hypertension   . Malignant neoplasm of lower-inner quadrant of female breast ([place])    11/03/2002: T1 C., N0 invasive mammary carcinoma, ER-positive, HER-2/neu not overexpressing. Treated with wide excision and sentinel node biopsy.  . Malignant neoplasm of upper limb (HCC)    left thumb, T1, N0 treated by partial thumb amputation, sentinel node biopsy.  Marland Kitchen Neuropathy    legs  . Peripheral nerve disease 03/18/2014  . SCC (squamous cell carcinoma), arm 05/05/2013   Overview:  Overview:  Left   . Skin cancer   . Stroke [facility])     Past Surgical History:  Procedure Laterality Date  . ABDOMINAL HYSTERECTOMY    . biopsy thumb Left 2013    0.4 x 1.8 x 1.8 cm histologic grade 2 invasive squamous cell. 7 mm from in situ carcinoma to resection, 1.2 cm from invasive cancer. Negative margins.pT1,N0  . BREAST BIOPSY Right 10/23/02   positive  . BREAST LUMPECTOMY Right 2004  . CHOLECYSTECTOMY    . COLONOSCOPY  2008  . COLONOSCOPY W/ POLYPECTOMY  06/23/2006 transverse colon tubular adenoma without high-grade dysplasia.  Marland Kitchen THUMB AMPUTATION Left 04/19/2012   Squamous cell carcinoma    Family History  Problem Relation Age of Onset  . Breast cancer Neg Hx     Social History Social History   Substance Use Topics  . Smoking status: Never Smoker  . Smokeless tobacco: Current User    Types: Snuff     Comment: used 35 years  . Alcohol use No    Allergies  Allergen Reactions  . Aspirin-Dipyridamole Er     Other reaction(s): Unknown  . Dipyridamole     Other reaction(s): Unknown  . Clonidine Other (See Comments)    Dry mouth  . Cymbalta  [Duloxetine Hcl]     Other reaction(s): Other (See Comments) insomnia  . Duloxetine     Other reaction(s): Other (See Comments) insomnia    Current Outpatient Prescriptions  Medication Sig Dispense Refill  . amLODipine (NORVASC) 10 MG tablet     . aspirin 81 MG tablet Take 81 mg by mouth daily.    Marland Kitchen atorvastatin (LIPITOR) 40 MG tablet Take 1 tablet by mouth daily.    . Cholecalciferol (VITAMIN D-3) 1000 UNITS CAPS Take 1 capsule by mouth daily.    . felodipine (PLENDIL) 5 MG 24 hr tablet Take 1 tablet by mouth daily.    Marland Kitchen lisinopril-hydrochlorothiazide (PRINZIDE,ZESTORETIC) 20-12.5 MG per tablet Take 1 tablet by mouth daily.    Marland Kitchen LYRICA 25 MG capsule Take 25 mg by mouth daily.     . metoprolol succinate (TOPROL-XL) 100 MG 24 hr tablet Take 1 tablet by mouth daily.    . nitrofurantoin, macrocrystal-monohydrate, (  MACROBID) 100 MG capsule Take 1 capsule (100 mg total) by mouth 2 (two) times daily. 14 capsule 0  . Omega-3 Fatty Acids (FISH OIL) 1200 MG CAPS Take 1 capsule by mouth daily.    . traMADol (ULTRAM) 50 MG tablet Take 50 mg by mouth 2 (two) times daily.     . vitamin B-12 (CYANOCOBALAMIN) 500 MCG tablet Take 500 mcg by mouth daily.     No current facility-administered medications for this visit.     Review of Systems Review of Systems  Constitutional: Negative.   Respiratory: Negative.   Cardiovascular: Negative.     Blood pressure 118/64, pulse 84, resp. rate 18, height 5' 9"  (1.753 m), weight 156 lb (70.8 kg), SpO2 97 %.  Physical Exam Physical Exam  Constitutional: She is oriented to person, place, and time. She  appears well-developed and well-nourished.  HENT:  Mouth/Throat: Oropharynx is clear and moist.  Eyes: Conjunctivae are normal. No scleral icterus.  Neck: Neck supple.  Cardiovascular: Normal rate.  An irregular rhythm present.  Mild swelling left hand  Pulmonary/Chest: Effort normal and breath sounds normal. Right breast exhibits no inverted nipple, no mass, no nipple discharge, no skin change and no tenderness. Left breast exhibits no inverted nipple, no mass, no nipple discharge, no skin change and no tenderness.    Musculoskeletal:       Arms: Lymphadenopathy:    She has no cervical adenopathy.    She has no axillary adenopathy.       Right: No supraclavicular adenopathy present.       Left: No supraclavicular adenopathy present.  Neurological: She is alert and oriented to person, place, and time.  Skin: Skin is warm and dry.  Psychiatric: Her behavior is normal.    Data Reviewed Patient elected to defer further mammograms last year.   Assessment    No evidence of recurrent breast cancer. No evidence recurrent cancer of the let thumb.     Plan          Follow up in one year office visit only. The patient is aware to call back for any questions or new concerns.   HPI, Physical Exam, Assessment and Plan have been scribed under the direction and in the presence of Robert Bellow, MD. Karie Fetch, RN  I have completed the exam and reviewed the above documentation for accuracy and completeness.  I agree with the above.  Haematologist has been used and any errors in dictation or transcription are unintentional.  Hervey Ard, M.D., F.A.C.S. Robert Bellow 07/16/2017, 7:30 PM

## 2017-10-02 ENCOUNTER — Encounter: Payer: Self-pay | Admitting: Emergency Medicine

## 2017-10-02 ENCOUNTER — Ambulatory Visit
Admission: EM | Admit: 2017-10-02 | Discharge: 2017-10-02 | Disposition: A | Discharge status: Home / Self Care | Payer: Medicare Other | Attending: Family Medicine | Admitting: Family Medicine

## 2017-10-02 ENCOUNTER — Other Ambulatory Visit: Payer: Self-pay

## 2017-10-02 DIAGNOSIS — E559 Vitamin D deficiency, unspecified: Secondary | ICD-10-CM | POA: Diagnosis not present

## 2017-10-02 DIAGNOSIS — Z8673 Personal history of transient ischemic attack (TIA), and cerebral infarction without residual deficits: Secondary | ICD-10-CM | POA: Diagnosis not present

## 2017-10-02 DIAGNOSIS — Z79899 Other long term (current) drug therapy: Secondary | ICD-10-CM | POA: Diagnosis not present

## 2017-10-02 DIAGNOSIS — Z853 Personal history of malignant neoplasm of breast: Secondary | ICD-10-CM | POA: Diagnosis not present

## 2017-10-02 DIAGNOSIS — Z87440 Personal history of urinary (tract) infections: Secondary | ICD-10-CM | POA: Insufficient documentation

## 2017-10-02 DIAGNOSIS — E1122 Type 2 diabetes mellitus with diabetic chronic kidney disease: Secondary | ICD-10-CM | POA: Insufficient documentation

## 2017-10-02 DIAGNOSIS — D649 Anemia, unspecified: Secondary | ICD-10-CM | POA: Insufficient documentation

## 2017-10-02 DIAGNOSIS — N183 Chronic kidney disease, stage 3 (moderate): Secondary | ICD-10-CM | POA: Insufficient documentation

## 2017-10-02 DIAGNOSIS — Z85828 Personal history of other malignant neoplasm of skin: Secondary | ICD-10-CM | POA: Diagnosis not present

## 2017-10-02 DIAGNOSIS — Z7982 Long term (current) use of aspirin: Secondary | ICD-10-CM | POA: Insufficient documentation

## 2017-10-02 DIAGNOSIS — E7801 Familial hypercholesterolemia: Secondary | ICD-10-CM | POA: Diagnosis not present

## 2017-10-02 DIAGNOSIS — R300 Dysuria: Secondary | ICD-10-CM | POA: Diagnosis present

## 2017-10-02 DIAGNOSIS — I129 Hypertensive chronic kidney disease with stage 1 through stage 4 chronic kidney disease, or unspecified chronic kidney disease: Secondary | ICD-10-CM | POA: Diagnosis not present

## 2017-10-02 DIAGNOSIS — E538 Deficiency of other specified B group vitamins: Secondary | ICD-10-CM | POA: Insufficient documentation

## 2017-10-02 DIAGNOSIS — N3000 Acute cystitis without hematuria: Secondary | ICD-10-CM | POA: Insufficient documentation

## 2017-10-02 LAB — URINALYSIS, COMPLETE (UACMP) WITH MICROSCOPIC
Glucose, UA: NEGATIVE mg/dL
Ketones, ur: NEGATIVE mg/dL
Nitrite: NEGATIVE
PH: 5.5 (ref 5.0–8.0)
Protein, ur: NEGATIVE mg/dL
SPECIFIC GRAVITY, URINE: 1.02 (ref 1.005–1.030)

## 2017-10-02 MED ORDER — CIPROFLOXACIN HCL 250 MG PO TABS: 250.0000 mg | ORAL_TABLET | Freq: Two times a day (BID) | ORAL | 0 refills | Status: DC

## 2017-10-02 NOTE — ED Triage Notes (Signed)
Patient in today c/o 1 week of slight dysuria and left lower back pain.

## 2017-10-02 NOTE — ED Provider Notes (Signed)
[facility]    CSN: [id number] Arrival date & time: 10/02/17  1620  History   Chief Complaint Chief Complaint  Patient presents with  . Dysuria  . Back Pain   HPI  84 year old female presents with dysuria and associated back pain.  Patient reports a one-week history of burning with urination.  Associated low back pain.  No flank pain.  No nausea vomiting.  No fever.  No known exacerbating relieving factors.  No medications or interventions tried.  She has a history of UTI.  No other associated symptoms.  No other complaints at this time.  Past Medical History:  Diagnosis Date  . Arthritis   . B12 deficiency 03/18/2014  . Benign essential HTN 03/18/2014  . Benign neoplasm of skin of upper limb, including shoulder   . Breast cancer ([place]) 12/02/02   lumpectomy/rad-positive  . 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 Colon polyp   . Hypertension   . Malignant neoplasm of lower-inner quadrant of female breast ([place])    11/03/2002: T1 C., N0 invasive mammary carcinoma, ER-positive, HER-2/neu not overexpressing. Treated with wide excision and sentinel node biopsy.  . Malignant neoplasm of upper limb (HCC)    left thumb, T1, N0 treated by partial thumb amputation, sentinel node biopsy.  Marland Kitchen Neuropathy    legs  . Peripheral nerve disease 03/18/2014  . SCC (squamous cell carcinoma), arm 05/05/2013   Overview:  Overview:  Left   . Skin cancer   . Stroke [facility])     Patient Active Problem List   Diagnosis Date Noted  . Nodule of chest wall 06/24/2016  . 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 Other long term (current) drug therapy 03/29/2015  . History of breast cancer 06/20/2014  . Chronic anemia 03/18/2014  . B12 deficiency 03/18/2014  . Chronic kidney disease (CKD), stage III (moderate) ([place]) 03/18/2014  . Benign essential HTN 03/18/2014  . 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 Combined fat and carbohydrate induced hyperlipemia 03/18/2014  .  Peripheral nerve disease ([place]) 03/18/2014  . Abnormal presence of protein in urine 03/18/2014  . Avitaminosis D 03/18/2014  . Breast cancer of lower-inner quadrant of right female breast ([place]) 05/05/2013  . Squamous cell cancer of skin of thumb 05/05/2013  . Malignant neoplasm of lower-inner quadrant of female breast ([place]) 05/05/2013  . SCC (squamous cell carcinoma), arm 05/05/2013    Past Surgical History:  Procedure Laterality Date  . ABDOMINAL HYSTERECTOMY    . biopsy thumb Left 2013    0.4 x 1.8 x 1.8 cm histologic grade 2 invasive squamous cell. 7 mm from in situ carcinoma to resection, 1.2 cm from invasive cancer. Negative margins.pT1,N0  . BREAST BIOPSY Right 10/23/02   positive  . BREAST LUMPECTOMY Right 2004  . CHOLECYSTECTOMY    . COLONOSCOPY  2008  . COLONOSCOPY W/ POLYPECTOMY  06/23/2006 transverse colon tubular adenoma without high-grade dysplasia.  Marland Kitchen THUMB AMPUTATION Left 04/19/2012   Squamous cell carcinoma    OB History    Gravida Para Term Preterm AB Living   _0 SAB TAB Ectopic Multiple Live Births                  Obstetric Comments   Menstrual age: 60  Age 1st Pregnancy: 24       Home Medications    Prior to Admission medications   Medication Sig Start Date End Date Taking? Authorizing  Provider  amLODipine (NORVASC) 10 MG tablet  06/10/14  Yes [provider]  aspirin 81 MG tablet Take 81 mg by mouth daily.   Yes [provider]  atorvastatin (LIPITOR) 40 MG tablet Take 1 tablet by mouth daily. 04/16/13  Yes [provider]  Cholecalciferol (VITAMIN D-3) 1000 UNITS CAPS Take 1 capsule by mouth daily.   Yes [provider]  felodipine (PLENDIL) 5 MG 24 hr tablet Take 1 tablet by mouth daily. 04/09/13  Yes [provider]  lisinopril-hydrochlorothiazide (PRINZIDE,ZESTORETIC) 20-12.5 MG per tablet Take 1 tablet by mouth daily. 04/16/13  Yes [provider]  metoprolol succinate (TOPROL-XL) 100  MG 24 hr tablet Take 1 tablet by mouth daily. 04/30/13  Yes [provider]  Omega-3 Fatty Acids (FISH OIL) 1200 MG CAPS Take 1 capsule by mouth daily.   Yes [provider]  traMADol (ULTRAM) 50 MG tablet Take 50 mg by mouth 2 (two) times daily.  06/17/17  Yes [provider]  vitamin B-12 (CYANOCOBALAMIN) 500 MCG tablet Take 500 mcg by mouth daily.   Yes [provider]  ciprofloxacin (CIPRO) 250 MG tablet Take 1 tablet (250 mg total) by mouth every 12 (twelve) hours. 10/02/17   Coral Spikes, DO    Family History Family History  Problem Relation Age of Onset  . Healthy Mother   . Healthy Father   . Dementia Sister   . Breast cancer Neg Hx     Social History Social History   Tobacco Use  . Smoking status: Never Smoker  . Smokeless tobacco: Current User    Types: Snuff  . Tobacco comment: used 35 years  Substance Use Topics  . Alcohol use: No  . Drug use: No     Allergies   Aspirin-dipyridamole er; Dipyridamole; Clonidine; Cymbalta  [duloxetine hcl]; and Duloxetine   Review of Systems Review of Systems  Constitutional: Negative for fever.  Gastrointestinal: Negative.   Genitourinary: Positive for dysuria.  Musculoskeletal: Positive for back pain.   Physical Exam Triage Vital Signs ED Triage Vitals  Enc Vitals Group     BP 10/02/17 1649 (!) 101/59     Pulse Rate 10/02/17 1649 (!) 111     Resp 10/02/17 1649 16     Temp 10/02/17 1649 97.6 F (36.4 C)     Temp Source 10/02/17 1649 Oral     SpO2 10/02/17 1649 98 %     Weight 10/02/17 1653 156 lb (70.8 kg)     Height 10/02/17 1653 '5\' 9"'$  (1.753 m)     Head Circumference --      Peak Flow --      Pain Score 10/02/17 1653 8     Pain Loc --      Pain Edu? --      Excl. in [place]? --    Updated Vital Signs BP (!) 101/59 (BP Location: Right Arm)   Pulse (!) 111   Temp 97.6 F (36.4 C) (Oral)   Resp 16   Ht '5\' 9"'$  (1.753 m)   Wt 156 lb (70.8 kg)   SpO2 98%   BMI 23.04 kg/m    Physical Exam  Constitutional:  Chronically ill-appearing female no acute distress.  HENT:  Head: Normocephalic and atraumatic.  Very poor dentition.  Cardiovascular: Regular rhythm.  Tachycardia.  Pulmonary/Chest: Effort normal. No respiratory distress.  Abdominal: Soft. She exhibits no distension. There is no tenderness.  Neurological: She is alert.  Psychiatric: She has a normal mood  and affect. Her behavior is normal.  Nursing note and vitals reviewed.   UC Treatments / Results  Labs (all labs ordered are listed, but only abnormal results are displayed) Labs Reviewed  URINALYSIS, COMPLETE (UACMP) WITH MICROSCOPIC - Abnormal; Notable for the following components:      Result Value   APPearance CLOUDY (*)    Hgb urine dipstick TRACE (*)    Bilirubin Urine SMALL (*)    Leukocytes, UA LARGE (*)    Squamous Epithelial / LPF 0-5 (*)    Non Squamous Epithelial PRESENT (*)    Bacteria, UA FEW (*)    All other components within normal limits  URINE CULTURE    EKG  EKG Interpretation None       Radiology No results found.  Procedures Procedures (including critical care time)  Medications Ordered in UC Medications - No data to display   Initial Impression / Assessment and Plan / UC Course  I have reviewed the triage vital signs and the nursing notes.  Pertinent labs & imaging results that were available during my care of the patient were reviewed by me and considered in my medical decision making (see chart for details).     84 year old female presents with I reviewed her prior urine cultures.  I am treating her with ciprofloxacin which has been renally dosed given her chronic kidney disease.  The MIC for this drug is very favorable as opposed to other sensitive drugs from her prior cultures including Bactrim.  Her prior cultures have been resistant to cephalosporins and amp/amoxicillin.  Nitrofurantoin is not recommended in the geriatric population.  MIC for  Bactrim was quite high.  As a result of all of these things ciprofloxacin was chosen.  Final Clinical Impressions(s) / UC Diagnoses   Final diagnoses:  Acute cystitis without hematuria    ED Discharge Orders        Ordered    ciprofloxacin (CIPRO) 250 MG tablet  Every 12 hours     10/02/17 1734     Controlled Substance Prescriptions [place] Controlled Substance Registry consulted? Not Applicable   Coral Spikes, DO 10/02/17 1743

## 2017-10-02 NOTE — Discharge Instructions (Signed)
Antibiotic as prescribed.  Take care  Dr. Tymar Polyak  

## 2017-10-04 LAB — URINE CULTURE: CULTURE: NO GROWTH

## 2017-10-20 ENCOUNTER — Inpatient Hospital Stay
Admission: EM | Admit: 2017-10-20 | Discharge: 2017-10-21 | DRG: 689 | Discharge status: Against Medical Advice | Payer: Medicare Other | Attending: Internal Medicine | Admitting: Internal Medicine

## 2017-10-20 ENCOUNTER — Inpatient Hospital Stay: Payer: Medicare Other

## 2017-10-20 ENCOUNTER — Emergency Department: Payer: Medicare Other

## 2017-10-20 ENCOUNTER — Other Ambulatory Visit: Payer: Self-pay

## 2017-10-20 DIAGNOSIS — I700 Atherosclerosis of aorta: Secondary | ICD-10-CM | POA: Diagnosis present

## 2017-10-20 DIAGNOSIS — F17220 Nicotine dependence, chewing tobacco, uncomplicated: Secondary | ICD-10-CM | POA: Diagnosis present

## 2017-10-20 DIAGNOSIS — Z79899 Other long term (current) drug therapy: Secondary | ICD-10-CM | POA: Diagnosis not present

## 2017-10-20 DIAGNOSIS — E785 Hyperlipidemia, unspecified: Secondary | ICD-10-CM | POA: Diagnosis present

## 2017-10-20 DIAGNOSIS — M1990 Unspecified osteoarthritis, unspecified site: Secondary | ICD-10-CM | POA: Diagnosis present

## 2017-10-20 DIAGNOSIS — Z8673 Personal history of transient ischemic attack (TIA), and cerebral infarction without residual deficits: Secondary | ICD-10-CM

## 2017-10-20 DIAGNOSIS — Z66 Do not resuscitate: Secondary | ICD-10-CM | POA: Diagnosis present

## 2017-10-20 DIAGNOSIS — J90 Pleural effusion, not elsewhere classified: Secondary | ICD-10-CM | POA: Diagnosis present

## 2017-10-20 DIAGNOSIS — R112 Nausea with vomiting, unspecified: Secondary | ICD-10-CM

## 2017-10-20 DIAGNOSIS — Z85828 Personal history of other malignant neoplasm of skin: Secondary | ICD-10-CM | POA: Diagnosis not present

## 2017-10-20 DIAGNOSIS — I10 Essential (primary) hypertension: Secondary | ICD-10-CM | POA: Diagnosis present

## 2017-10-20 DIAGNOSIS — I4891 Unspecified atrial fibrillation: Secondary | ICD-10-CM | POA: Diagnosis present

## 2017-10-20 DIAGNOSIS — R918 Other nonspecific abnormal finding of lung field: Secondary | ICD-10-CM | POA: Diagnosis present

## 2017-10-20 DIAGNOSIS — Z7982 Long term (current) use of aspirin: Secondary | ICD-10-CM | POA: Diagnosis not present

## 2017-10-20 DIAGNOSIS — E871 Hypo-osmolality and hyponatremia: Secondary | ICD-10-CM | POA: Diagnosis present

## 2017-10-20 DIAGNOSIS — N3000 Acute cystitis without hematuria: Secondary | ICD-10-CM | POA: Diagnosis not present

## 2017-10-20 DIAGNOSIS — R531 Weakness: Secondary | ICD-10-CM

## 2017-10-20 DIAGNOSIS — I739 Peripheral vascular disease, unspecified: Secondary | ICD-10-CM | POA: Diagnosis present

## 2017-10-20 DIAGNOSIS — Z888 Allergy status to other drugs, medicaments and biological substances status: Secondary | ICD-10-CM | POA: Diagnosis not present

## 2017-10-20 DIAGNOSIS — R778 Other specified abnormalities of plasma proteins: Secondary | ICD-10-CM

## 2017-10-20 DIAGNOSIS — J189 Pneumonia, unspecified organism: Secondary | ICD-10-CM | POA: Diagnosis present

## 2017-10-20 DIAGNOSIS — Z853 Personal history of malignant neoplasm of breast: Secondary | ICD-10-CM

## 2017-10-20 DIAGNOSIS — Z5321 Procedure and treatment not carried out due to patient leaving prior to being seen by health care provider: Secondary | ICD-10-CM | POA: Diagnosis not present

## 2017-10-20 DIAGNOSIS — R7989 Other specified abnormal findings of blood chemistry: Secondary | ICD-10-CM

## 2017-10-20 LAB — URINALYSIS, COMPLETE (UACMP) WITH MICROSCOPIC
Bacteria, UA: NONE SEEN
Bilirubin Urine: NEGATIVE
GLUCOSE, UA: NEGATIVE mg/dL
Hgb urine dipstick: NEGATIVE
Ketones, ur: NEGATIVE mg/dL
Nitrite: NEGATIVE
PH: 5 (ref 5.0–8.0)
Protein, ur: NEGATIVE mg/dL
SPECIFIC GRAVITY, URINE: 1.017 (ref 1.005–1.030)

## 2017-10-20 LAB — COMPREHENSIVE METABOLIC PANEL
ALT: 14 U/L (ref 14–54)
AST: 26 U/L (ref 15–41)
Albumin: 3.4 g/dL — ABNORMAL LOW (ref 3.5–5.0)
Alkaline Phosphatase: 115 U/L (ref 38–126)
Anion gap: 13 (ref 5–15)
BILIRUBIN TOTAL: 0.7 mg/dL (ref 0.3–1.2)
BUN: 21 mg/dL — AB (ref 6–20)
CHLORIDE: 96 mmol/L — AB (ref 101–111)
CO2: 22 mmol/L (ref 22–32)
CREATININE: 1.35 mg/dL — AB (ref 0.44–1.00)
Calcium: 9.1 mg/dL (ref 8.9–10.3)
GFR, EST AFRICAN AMERICAN: 39 mL/min — AB (ref >60)
GFR, EST NON AFRICAN AMERICAN: 33 mL/min — AB (ref >60)
Glucose, Bld: 211 mg/dL — ABNORMAL HIGH (ref 65–99)
Potassium: 3.4 mmol/L — ABNORMAL LOW (ref 3.5–5.1)
Sodium: 131 mmol/L — ABNORMAL LOW (ref 135–145)
TOTAL PROTEIN: 6.3 g/dL — AB (ref 6.5–8.1)

## 2017-10-20 LAB — CBC WITH DIFFERENTIAL/PLATELET
Basophils Absolute: 0.1 10*3/uL (ref 0–0.1)
Basophils Relative: 1 %
EOS PCT: 2 %
Eosinophils Absolute: 0.1 10*3/uL (ref 0–0.7)
HEMATOCRIT: 36.4 % (ref 35.0–47.0)
Hemoglobin: 11.9 g/dL — ABNORMAL LOW (ref 12.0–16.0)
LYMPHS ABS: 2 10*3/uL (ref 1.0–3.6)
LYMPHS PCT: 24 %
MCH: 28.1 pg (ref 26.0–34.0)
MCHC: 32.7 g/dL (ref 32.0–36.0)
MCV: 85.8 fL (ref 80.0–100.0)
MONO ABS: 0.7 10*3/uL (ref 0.2–0.9)
Monocytes Relative: 9 %
Neutro Abs: 5.5 10*3/uL (ref 1.4–6.5)
Neutrophils Relative %: 64 %
PLATELETS: 281 10*3/uL (ref 150–440)
RBC: 4.24 MIL/uL (ref 3.80–5.20)
RDW: 15.7 % — AB (ref 11.5–14.5)
WBC: 8.4 10*3/uL (ref 3.6–11.0)

## 2017-10-20 LAB — TROPONIN I: TROPONIN I: 0.03 ng/mL — AB (ref <0.03)

## 2017-10-20 MED ORDER — VITAMIN D 1000 UNITS PO TABS
1000.0000 [IU] | ORAL_TABLET | Freq: Every day | ORAL | Status: DC
Start: 2017-10-20 — End: 2017-10-21
  Administered 2017-10-20 – 2017-10-21 (×2): 1000 [IU] via ORAL
  Filled 2017-10-20 (×2): qty 1

## 2017-10-20 MED ORDER — SODIUM CHLORIDE 0.9% FLUSH
3.0000 mL | Freq: Two times a day (BID) | INTRAVENOUS | Status: DC
  Administered 2017-10-21: 3 mL via INTRAVENOUS

## 2017-10-20 MED ORDER — ASPIRIN EC 81 MG PO TBEC
81.0000 mg | DELAYED_RELEASE_TABLET | Freq: Every day | ORAL | Status: DC
  Administered 2017-10-21: 81 mg via ORAL
  Filled 2017-10-20: qty 1

## 2017-10-20 MED ORDER — CEFTRIAXONE SODIUM IN DEXTROSE 20 MG/ML IV SOLN
1.0000 g | Freq: Once | INTRAVENOUS | Status: AC
  Administered 2017-10-20: 1 g via INTRAVENOUS
  Filled 2017-10-20: qty 50

## 2017-10-20 MED ORDER — PREGABALIN 50 MG PO CAPS
50.0000 mg | ORAL_CAPSULE | Freq: Two times a day (BID) | ORAL | Status: DC
  Filled 2017-10-20: qty 1

## 2017-10-20 MED ORDER — FELODIPINE ER 5 MG PO TB24
5.0000 mg | ORAL_TABLET | Freq: Every day | ORAL | Status: DC
  Administered 2017-10-20 – 2017-10-21 (×2): 5 mg via ORAL
  Filled 2017-10-20 (×2): qty 1

## 2017-10-20 MED ORDER — METOPROLOL SUCCINATE ER 100 MG PO TB24
100.0000 mg | ORAL_TABLET | Freq: Every day | ORAL | Status: DC
  Administered 2017-10-20 – 2017-10-21 (×2): 100 mg via ORAL
  Filled 2017-10-20 (×2): qty 1

## 2017-10-20 MED ORDER — DEXTROSE 5 % IV SOLN
INTRAVENOUS | Status: AC
  Filled 2017-10-20: qty 10

## 2017-10-20 MED ORDER — ATORVASTATIN CALCIUM 20 MG PO TABS
40.0000 mg | ORAL_TABLET | Freq: Every day | ORAL | Status: DC
  Administered 2017-10-20 – 2017-10-21 (×2): 40 mg via ORAL
  Filled 2017-10-20 (×2): qty 2

## 2017-10-20 MED ORDER — CEFTRIAXONE SODIUM IN DEXTROSE 20 MG/ML IV SOLN
1.0000 g | INTRAVENOUS | Status: DC
  Administered 2017-10-20: 1 g via INTRAVENOUS
  Filled 2017-10-20: qty 50

## 2017-10-20 MED ORDER — ACETAMINOPHEN 325 MG PO TABS: 650.0000 mg | ORAL_TABLET | Freq: Four times a day (QID) | ORAL | Status: DC | PRN

## 2017-10-20 MED ORDER — ONDANSETRON HCL 4 MG/2ML IJ SOLN
4.0000 mg | Freq: Once | INTRAMUSCULAR | Status: AC
  Administered 2017-10-20: 4 mg via INTRAVENOUS
  Filled 2017-10-20: qty 2

## 2017-10-20 MED ORDER — ONDANSETRON HCL 4 MG/2ML IJ SOLN: 4.0000 mg | Freq: Four times a day (QID) | INTRAMUSCULAR | Status: DC | PRN

## 2017-10-20 MED ORDER — ACETAMINOPHEN 325 MG PO TABS: 650.0000 mg | ORAL_TABLET | ORAL | Status: DC | PRN

## 2017-10-20 MED ORDER — ONDANSETRON HCL 4 MG/2ML IJ SOLN
4.0000 mg | Freq: Four times a day (QID) | INTRAMUSCULAR | Status: DC | PRN
  Administered 2017-10-20: 4 mg via INTRAVENOUS
  Filled 2017-10-20: qty 2

## 2017-10-20 MED ORDER — DEXTROSE 5 % IV SOLN
250.0000 mg | Freq: Every day | INTRAVENOUS | Status: DC
  Administered 2017-10-20: 250 mg via INTRAVENOUS
  Filled 2017-10-20 (×5): qty 250

## 2017-10-20 MED ORDER — SODIUM CHLORIDE 0.9 % IV SOLN
Freq: Once | INTRAVENOUS | Status: AC
  Administered 2017-10-20: 07:00:00 via INTRAVENOUS

## 2017-10-20 MED ORDER — ASPIRIN 81 MG PO CHEW
324.0000 mg | CHEWABLE_TABLET | Freq: Once | ORAL | Status: AC
  Administered 2017-10-20: 324 mg via ORAL
  Filled 2017-10-20: qty 4

## 2017-10-20 MED ORDER — DEXTROSE 5 % IV SOLN
INTRAVENOUS | Status: DC
  Filled 2017-10-20 (×2): qty 10

## 2017-10-20 MED ORDER — SODIUM CHLORIDE 0.9 % IV SOLN
INTRAVENOUS | Status: DC
  Administered 2017-10-20 – 2017-10-21 (×2): via INTRAVENOUS

## 2017-10-20 MED ORDER — CEFTRIAXONE SODIUM IN DEXTROSE 20 MG/ML IV SOLN
1.0000 g | INTRAVENOUS | Status: DC
  Filled 2017-10-20: qty 50

## 2017-10-20 MED ORDER — VITAMIN B-12 1000 MCG PO TABS
500.0000 ug | ORAL_TABLET | Freq: Every day | ORAL | Status: DC
Start: 2017-10-20 — End: 2017-10-21
  Administered 2017-10-20 – 2017-10-21 (×2): 500 ug via ORAL
  Filled 2017-10-20 (×2): qty 1

## 2017-10-20 MED ORDER — TRAZODONE HCL 50 MG PO TABS
50.0000 mg | ORAL_TABLET | Freq: Every day | ORAL | Status: DC
  Administered 2017-10-20: 50 mg via ORAL
  Filled 2017-10-20: qty 1

## 2017-10-20 NOTE — H&P (Signed)
[facility] NAME: Emily Moyer    MR#:  [id number]  DATE OF BIRTH:  1924-03-15  DATE OF ADMISSION:  10/20/2017  PRIMARY CARE PHYSICIAN: Ezequiel Kayser, MD   REQUESTING/REFERRING PHYSICIAN: [email or web address]  CHIEF COMPLAINT:   Chief Complaint  Patient presents with  . Nausea  . Emesis  . Weakness    HISTORY OF PRESENT ILLNESS: Emily Moyer  is a 84 y.o. female with a known history of Breast cancer, Htn, colon polyp, arthritis, stroke- lives with her daughter, able to walk around with her walker and have some early signs of memory issues but still able to take care of herself. Still yesterday was doing fine, today she was feeling weak and not by herself, so she told her daughter about that. Soon after that she had some nausea and vomiting so daughter brought her to emergency room. In ER, she is noted to have UTI and pleural effusion bilaterally with some mass in her lung. She is given some IV fluids, started on antibiotic and given to hospitalist team for further management.  PAST MEDICAL HISTORY:   Past Medical History:  Diagnosis Date  . Arthritis   . B12 deficiency 03/18/2014  . Benign essential HTN 03/18/2014  . Benign neoplasm of skin of upper limb, including shoulder   . Breast cancer ([place]) 12/02/02   lumpectomy/rad-positive  . 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 Colon polyp   . Hypertension   . Malignant neoplasm of lower-inner quadrant of female breast ([place])    11/03/2002: T1 C., N0 invasive mammary carcinoma, ER-positive, HER-2/neu not overexpressing. Treated with wide excision and sentinel node biopsy.  . Malignant neoplasm of upper limb ([facility])    left thumb, T1, N0 treated by partial thumb amputation, sentinel node biopsy.  Marland Kitchen Neuropathy    legs  . Peripheral nerve disease 03/18/2014  . SCC (squamous cell carcinoma), arm 05/05/2013   Overview:  Overview:  Left   . Skin cancer   . Stroke [facility])     PAST SURGICAL HISTORY:   Past Surgical History:  Procedure Laterality Date  . ABDOMINAL HYSTERECTOMY    . biopsy thumb Left 2013    0.4 x 1.8 x 1.8 cm histologic grade 2 invasive squamous cell. 7 mm from in situ carcinoma to resection, 1.2 cm from invasive cancer. Negative margins.pT1,N0  . BREAST BIOPSY Right 10/23/02   positive  . BREAST LUMPECTOMY Right 2004  . CHOLECYSTECTOMY    . COLONOSCOPY  2008  . COLONOSCOPY W/ POLYPECTOMY  06/23/2006 transverse colon tubular adenoma without high-grade dysplasia.  Marland Kitchen THUMB AMPUTATION Left 04/19/2012   Squamous cell carcinoma    SOCIAL HISTORY:  Social History   Tobacco Use  . Smoking status: Never Smoker  . Smokeless tobacco: Current User    Types: Snuff  . Tobacco comment: used 35 years  Substance Use Topics  . Alcohol use: No    FAMILY HISTORY:  Family History  Problem Relation Age of Onset  . Healthy Mother   . Healthy Father   . Dementia Sister   . Breast cancer Neg Hx     DRUG ALLERGIES:  Allergies  Allergen Reactions  . Aspirin-Dipyridamole Er     Other reaction(s): Unknown  . Dipyridamole     Other reaction(s): Unknown  . Clonidine Other (See Comments)    Dry mouth  . Cymbalta  [Duloxetine Hcl]     Other reaction(s): Other (See Comments) insomnia  . Duloxetine  Other reaction(s): Other (See Comments) insomnia    REVIEW OF SYSTEMS:   CONSTITUTIONAL: No fever,positive for fatigue or weakness.  EYES: No blurred or double vision.  EARS, NOSE, AND THROAT: No tinnitus or ear pain.  RESPIRATORY: No cough, shortness of breath, wheezing or hemoptysis.  CARDIOVASCULAR: No chest pain, orthopnea, edema.  GASTROINTESTINAL: she had nausea, vomiting,no diarrhea or abdominal pain.  GENITOURINARY: No dysuria, hematuria.  ENDOCRINE: No polyuria, nocturia,  HEMATOLOGY: No anemia, easy bruising or bleeding SKIN: No rash or lesion. MUSCULOSKELETAL: No joint pain or arthritis.   NEUROLOGIC: No tingling, numbness, weakness.  PSYCHIATRY: No  anxiety or depression.   MEDICATIONS AT HOME:  Prior to Admission medications   Medication Sig Start Date End Date Taking? Authorizing Provider  amLODipine (NORVASC) 10 MG tablet Take 10 mg by mouth daily.    Yes [provider]  aspirin 81 MG tablet Take 81 mg by mouth daily.   Yes [provider]  atorvastatin (LIPITOR) 40 MG tablet Take 1 tablet by mouth daily. 04/16/13  Yes [provider]  Cholecalciferol (VITAMIN D-3) 1000 UNITS CAPS Take 1 capsule by mouth daily.   Yes [provider]  lisinopril-hydrochlorothiazide (PRINZIDE,ZESTORETIC) 20-12.5 MG per tablet Take 2 tablets by mouth daily.  04/16/13  Yes [provider]  metoprolol succinate (TOPROL-XL) 100 MG 24 hr tablet Take 1 tablet by mouth daily. 04/30/13  Yes [provider]  Omega-3 Fatty Acids (FISH OIL) 1200 MG CAPS Take 1 capsule by mouth daily.   Yes [provider]  pregabalin (LYRICA) 50 MG capsule Take 50 mg by mouth 2 (two) times daily.   Yes [provider]  traMADol (ULTRAM) 50 MG tablet Take 50 mg by mouth every 6 (six) hours as needed.  06/17/17  Yes [provider]  traZODone (DESYREL) 50 MG tablet Take 50 mg by mouth at bedtime.   Yes [provider]  ciprofloxacin (CIPRO) 250 MG tablet Take 1 tablet (250 mg total) by mouth every 12 (twelve) hours. Patient not taking: Reported on 10/20/2017 10/02/17   Coral Spikes, DO  vitamin B-12 (CYANOCOBALAMIN) 500 MCG tablet Take 500 mcg by mouth daily.    [provider]      PHYSICAL EXAMINATION:   VITAL SIGNS: Blood pressure 111/81, pulse 99, temperature (!) 97.5 F (36.4 C), temperature source Oral, resp. rate 18, height 5' 6"  (1.676 m), weight 70.8 kg (156 lb), SpO2 97 %.  GENERAL:  84 y.o.-year-old patient lying in the bed with no acute distress.  EYES: Pupils equal, round, reactive to light and accommodation. No scleral icterus. Extraocular muscles intact.  HEENT: Head  atraumatic, normocephalic. Oropharynx and nasopharynx clear.  NECK:  Supple, no jugular venous distention. No thyroid enlargement, no tenderness.  LUNGS: decreased breath sounds bilaterally, no wheezing, rales,rhonchi or crepitation. No use of accessory muscles of respiration.supplemental oxygen.  CARDIOVASCULAR: S1, S2 normal. No murmurs, rubs, or gallops.  ABDOMEN: Soft, nontender, nondistended. Bowel sounds present. No organomegaly or mass.  EXTREMITIES: No pedal edema, cyanosis, or clubbing.  NEUROLOGIC: Cranial nerves II through XII are intact. Muscle strength 3-4/5 in all extremities. Sensation intact. Gait not checked.  PSYCHIATRIC: The patient is alert and oriented x 2.  SKIN: No obvious rash, lesion, or ulcer.   LABORATORY PANEL:   CBC Recent Labs  Lab 10/20/17 0657  WBC 8.4  HGB 11.9*  HCT 36.4  PLT 281  MCV 85.8  MCH 28.1  MCHC 32.7  RDW 15.7*  LYMPHSABS 2.0  MONOABS 0.7  EOSABS 0.1  BASOSABS 0.1   ------------------------------------------------------------------------------------------------------------------  Chemistries  Recent Labs  Lab 10/20/17 0657  NA 131*  K 3.4*  CL 96*  CO2 22  GLUCOSE 211*  BUN 21*  CREATININE 1.35*  CALCIUM 9.1  AST 26  ALT 14  ALKPHOS 115  BILITOT 0.7   ------------------------------------------------------------------------------------------------------------------ estimated creatinine clearance is 25.4 mL/min (A) (by C-G formula based on SCr of 1.35 mg/dL (H)). ------------------------------------------------------------------------------------------------------------------ No results for input(s): TSH, T4TOTAL, T3FREE, THYROIDAB in the last 72 hours.  Invalid input(s): FREET3   Coagulation profile No results for input(s): INR, PROTIME in the last 168 hours. ------------------------------------------------------------------------------------------------------------------- No results for input(s): DDIMER in the last  72 hours. -------------------------------------------------------------------------------------------------------------------  Cardiac Enzymes Recent Labs  Lab 10/20/17 0657  TROPONINI 0.03*   ------------------------------------------------------------------------------------------------------------------ Invalid input(s): POCBNP  ---------------------------------------------------------------------------------------------------------------  Urinalysis    Component Value Date/Time   COLORURINE YELLOW (A) 10/20/2017 0834   APPEARANCEUR CLOUDY (A) 10/20/2017 0834   APPEARANCEUR Clear 10/02/2015 1051   LABSPEC 1.017 10/20/2017 0834   LABSPEC 1.008 04/12/2012 1058   PHURINE 5.0 10/20/2017 0834   GLUCOSEU NEGATIVE 10/20/2017 0834   GLUCOSEU Negative 04/12/2012 1058   HGBUR NEGATIVE 10/20/2017 0834   BILIRUBINUR NEGATIVE 10/20/2017 0834   BILIRUBINUR Negative 10/02/2015 1051   BILIRUBINUR Negative 04/12/2012 [place] 10/20/2017 0834   PROTEINUR NEGATIVE 10/20/2017 0834   NITRITE NEGATIVE 10/20/2017 0834   LEUKOCYTESUR LARGE (A) 10/20/2017 0834   LEUKOCYTESUR Negative 10/02/2015 1051   LEUKOCYTESUR Negative 04/12/2012 1058     RADIOLOGY: Dg Chest 1 View  Result Date: 10/20/2017 CLINICAL DATA:  84 year old female with nausea and vomiting. Breast cancer post lumpectomy. Hypertension. Nonsmoker. Initial encounter. EXAM: CHEST 1 VIEW COMPARISON:  04/12/2012. FINDINGS: Pulmonary edema bilateral pleural effusions greater on right. Basal atelectasis suspected. Heart size top-normal. Calcified aorta. No acute osseous abnormality. IMPRESSION: Pulmonary edema with bilateral pleural effusions greater on right. Basilar subsegmental atelectasis. Aortic Atherosclerosis (ICD10-I70.0). Electronically Signed   By: Genia Del M.D.   On: 10/20/2017 07:58   Ct Head Wo Contrast  Result Date: 10/20/2017 CLINICAL DATA:  84 year old female with sudden onset of nausea vomiting and  weakness. Hypertension. Prior infarct. Initial encounter. EXAM: CT HEAD WITHOUT CONTRAST TECHNIQUE: Contiguous axial images were obtained from the base of the skull through the vertex without intravenous contrast. COMPARISON:  None. FINDINGS: Brain: No intracranial hemorrhage or CT evidence of large acute infarct. Remote posterior right frontal-parietal lobe infarct. Remote left lenticular nucleus/caudate infarct. Moderate chronic microvascular changes. Mild global atrophy. No intracranial mass lesion noted on this unenhanced exam. Prominent dural calcifications greater on left. Vascular: Vascular calcifications Skull: No skull fracture Sinuses/Orbits: Visualized orbital structures and paranasal sinuses unremarkable. Other: Mastoid air cells and middle ear cavities are clear. IMPRESSION: No acute intracranial abnormality. Remote posterior right frontal-parietal lobe infarct. Remote left lenticular nucleus/caudate infarct. Moderate chronic microvascular changes. Electronically Signed   By: Genia Del M.D.   On: 10/20/2017 08:47   Ct Chest Wo Contrast  Result Date: 10/20/2017 CLINICAL DATA:  Acute respiratory illness with pleural effusion. EXAM: CT CHEST WITHOUT CONTRAST TECHNIQUE: Multidetector CT imaging of the chest was performed following the standard protocol without IV contrast. COMPARISON:  Chest x-ray from earlier today FINDINGS: Cardiovascular: Biatrial enlargement. No pericardial effusion. Atherosclerotic calcification of the aorta and coronaries. Large main pulmonary artery as seen with pulmonary hypertension, 3.4 cm. Mediastinum/Nodes: Negative for adenopathy. There is a peripherally calcified nodule posteriorly from the left lobe thyroid measuring 2.2 cm. No  associated adenopathy. Lungs/Pleura: Small left and more moderate right pleural effusions that are layering. Diffuse interlobular septal thickening and bronchial wall thickening. Atelectasis in the lower lobes with ground-glass density that  could be alveolar edema or incomplete atelectasis. There is a focal ill-defined 2.1 cm dense opacity in the left upper lobe which is different than the other findings. Small subpleural pulmonary nodules bilaterally which will be seen at follow-up. Upper Abdomen: No acute finding. Musculoskeletal: L1 superior endplate fracture with sclerosis and horizontal fracture plane beneath the mildly depressed superior endplate. This is likely not acute but does not appear healed. No retropulsion. IMPRESSION: 1. Moderate right and small left layering pleural effusions with pulmonary edema and lower lobe atelectasis. Biatrial enlargement. 2. 3 cm spiculated opacity in the superior segment left lower lobe which could be incidental neoplasm or pneumonia. Recommend follow-up noncontrast chest CT in 2 to 3 months. 3. L1 compression fracture that is likely subacute and unhealed. No retropulsion. Height loss is mild. 4. Aortic Atherosclerosis (ICD10-I70.0). Electronically Signed   By: Monte Fantasia M.D.   On: 10/20/2017 10:31    EKG: Orders placed or performed during the hospital encounter of 10/20/17  . EKG 12-Lead  . EKG 12-Lead    IMPRESSION AND PLAN:  * UTI   IV ceftriaxone, check for urine culture.  * community-acquired pneumonia   Will give Rocephin and azithromycin.  * right-sided pleural effusion   This may be secondary to pneumonia, continue to monitor.  * left lung mass   This is incidental finding, but she has a history of breast cancer.   I discussed with her daughter in the room, due to her age and multiple other health issues, we can just monitor for now and I advised her to just follow up on that with her primary care physician but encouraged her not to go very aggressive with further diagnostic and treatment for that.  * hyperlipidemia   Cont statin.   All the records are reviewed and case discussed with ED provider. Management plans discussed with the patient, family and they are in  agreement.  CODE STATUS: DNR    Code Status Orders  (From admission, onward)        Start     Ordered   10/20/17 1440  Do not attempt resuscitation (DNR)  Continuous    Question Answer Comment  In the event of cardiac or respiratory ARREST Do not call a "code blue"   In the event of cardiac or respiratory ARREST Do not perform Intubation, CPR, defibrillation or ACLS   In the event of cardiac or respiratory ARREST Use medication by any route, position, wound care, and other measures to relive pain and suffering. May use oxygen, suction and manual treatment of airway obstruction as needed for comfort.   Comments discussed with her daughter in room.      10/20/17 1440    Code Status History    Date Active Date Inactive Code Status Order ID Comments User Context   This patient has a current code status but no historical code status.     Discussed with her daughter in room.  TOTAL TIME TAKING CARE OF THIS PATIENT: 45 minutes.    Vaughan Basta M.D on 10/20/2017   Between 7am to 6pm - Pager - [phone]  After 6pm go to [email or web address] - password [email or web address] [facility]  Office  [phone]  CC: Primary care physician; Ezequiel Kayser, MD   Note: This dictation was prepared with  Dragon dictation along with smaller Company secretary. Any transcriptional errors that result from this process are unintentional.

## 2017-10-20 NOTE — ED Notes (Signed)
Pt resting on stretcher, side rails up. Lights dimmed for pt. Family at bedside, watching tv.

## 2017-10-20 NOTE — ED Notes (Signed)
Pt getting PXCR at this time.

## 2017-10-20 NOTE — ED Notes (Signed)
Date and time results received: 10/20/17 . 0806   Test: troponin Critical Value: 0.03  Name of Provider Notified: Dr. Jimmye Norman  Orders Received? Or Actions Taken?: Orders Received - See Orders for details

## 2017-10-20 NOTE — ED Provider Notes (Signed)
[facility] Emergency Department Provider Note       Time seen: ----------------------------------------- 7:14 AM on 10/20/2017 -----------------------------------------   I have reviewed the triage vital signs and the nursing notes.  HISTORY   Chief Complaint Nausea; Emesis; and Weakness    HPI Emily Moyer is a 84 y.o. female with a history of hypertension, neuropathy, CVA, arthritis, breast cancer who presents to the ED for sudden onset of nausea vomiting weakness around 5 AM.  EMS reports patient was lethargic upon arrival and in route to the hospital.  She was given Zofran by EMS.  EMS reports history of CVA, hypertension and A. fib.  She is denying any pain.  Past Medical History:  Diagnosis Date  . Arthritis   . B12 deficiency 03/18/2014  . Benign essential HTN 03/18/2014  . Benign neoplasm of skin of upper limb, including shoulder   . Breast cancer ([place]) 12/02/02   lumpectomy/rad-positive  . 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 Colon polyp   . Hypertension   . Malignant neoplasm of lower-inner quadrant of female breast ([place])    11/03/2002: T1 C., N0 invasive mammary carcinoma, ER-positive, HER-2/neu not overexpressing. Treated with wide excision and sentinel node biopsy.  . Malignant neoplasm of upper limb (HCC)    left thumb, T1, N0 treated by partial thumb amputation, sentinel node biopsy.  Marland Kitchen Neuropathy    legs  . Peripheral nerve disease 03/18/2014  . SCC (squamous cell carcinoma), arm 05/05/2013   Overview:  Overview:  Left   . Skin cancer   . Stroke [facility])     Patient Active Problem List   Diagnosis Date Noted  . Nodule of chest wall 06/24/2016  . 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 Other long term (current) drug therapy 03/29/2015  . History of breast cancer 06/20/2014  . Chronic anemia 03/18/2014  . B12 deficiency 03/18/2014  . Chronic kidney disease (CKD), stage III (moderate) ([place]) 03/18/2014   . Benign essential HTN 03/18/2014  . Chemical diabetes ([place]) 03/18/2014  . Combined fat and carbohydrate induced hyperlipemia 03/18/2014  . Peripheral nerve disease ([place]) 03/18/2014  . Abnormal presence of protein in urine 03/18/2014  . Avitaminosis D 03/18/2014  . Breast cancer of lower-inner quadrant of right female breast ([place]) 05/05/2013  . Squamous cell cancer of skin of thumb 05/05/2013  . Malignant neoplasm of lower-inner quadrant of female breast ([place]) 05/05/2013  . SCC (squamous cell carcinoma), arm 05/05/2013    Past Surgical History:  Procedure Laterality Date  . ABDOMINAL HYSTERECTOMY    . biopsy thumb Left 2013    0.4 x 1.8 x 1.8 cm histologic grade 2 invasive squamous cell. 7 mm from in situ carcinoma to resection, 1.2 cm from invasive cancer. Negative margins.pT1,N0  . BREAST BIOPSY Right 10/23/02   positive  . BREAST LUMPECTOMY Right 2004  . CHOLECYSTECTOMY    . COLONOSCOPY  2008  . COLONOSCOPY W/ POLYPECTOMY  06/23/2006 transverse colon tubular adenoma without high-grade dysplasia.  Marland Kitchen THUMB AMPUTATION Left 04/19/2012   Squamous cell carcinoma    Allergies Aspirin-dipyridamole er; Dipyridamole; Clonidine; Cymbalta  [duloxetine hcl]; and Duloxetine  Social History Social History   Tobacco Use  . Smoking status: Never Smoker  . Smokeless tobacco: Current User    Types: Snuff  . Tobacco comment: used 35 years  Substance Use Topics  . Alcohol use: No  . Drug use: No    Review of Systems Constitutional: Negative for fever. Cardiovascular:  Negative for chest pain. Respiratory: Negative for shortness of breath. Gastrointestinal: Negative for abdominal pain, positive for vomiting Musculoskeletal: Negative for back pain. Skin: Negative for rash. Neurological: Negative for headaches, focal weakness or numbness.  All systems negative/normal/unremarkable except as stated in the HPI  ____________________________________________   PHYSICAL EXAM:  VITAL  SIGNS: ED Triage Vitals  Enc Vitals Group     BP 10/20/17 0650 125/83     Pulse Rate 10/20/17 0650 (!) 120     Resp --      Temp 10/20/17 0650 (!) 97.5 F (36.4 C)     Temp Source 10/20/17 0650 Oral     SpO2 10/20/17 0649 93 %     Weight 10/20/17 0651 156 lb (70.8 kg)     Height 10/20/17 0651 '5\' 6"'$  (1.676 m)     Head Circumference --      Peak Flow --      Pain Score 10/20/17 0649 0     Pain Loc --      Pain Edu? --      Excl. in [employer]? --     Constitutional: Lethargic but easily arousable.  Chronically ill-appearing, mild distress Eyes: Conjunctivae are normal. Normal extraocular movements. ENT   Head: Normocephalic and atraumatic.   Nose: No congestion/rhinnorhea.   Mouth/Throat: Mucous membranes are moist.  Edentulous   Neck: No stridor. Cardiovascular: Rapid rate, irregular rhythm. No murmurs, rubs, or gallops. Respiratory: Normal respiratory effort without tachypnea nor retractions. Breath sounds are clear and equal bilaterally. No wheezes/rales/rhonchi. Gastrointestinal: Distended but nontender.  Normal bowel sounds. Musculoskeletal: Nontender with normal range of motion in extremities. No lower extremity tenderness nor edema. Neurologic:  Normal speech and language. No gross focal neurologic deficits are appreciated.  Generalized weakness, nothing focal Skin:  Skin is warm, dry and intact. No rash noted. ____________________________________________  EKG: Interpreted by me.  Atrial fibrillation with a rapid ventricular response, rate is 122 bpm, low voltage, possible septal infarct, ST depressions are noted  ____________________________________________  ED COURSE:  As part of my medical decision making, I reviewed the following data within the [place] History obtained from family if available, nursing notes, old chart and ekg, as well as notes from prior ED visits. Patient presented for weakness and vomiting, we will assess with labs and  imaging as indicated at this time.   Procedures ____________________________________________   LABS (pertinent positives/negatives)  Labs Reviewed  CBC WITH DIFFERENTIAL/PLATELET - Abnormal; Notable for the following components:      Result Value   Hemoglobin 11.9 (*)    RDW 15.7 (*)    All other components within normal limits  COMPREHENSIVE METABOLIC PANEL - Abnormal; Notable for the following components:   Sodium 131 (*)    Potassium 3.4 (*)    Chloride 96 (*)    Glucose, Bld 211 (*)    BUN 21 (*)    Creatinine, Ser 1.35 (*)    Total Protein 6.3 (*)    Albumin 3.4 (*)    GFR calc non Af Amer 33 (*)    GFR calc Af Amer 39 (*)    All other components within normal limits  TROPONIN I - Abnormal; Notable for the following components:   Troponin I 0.03 (*)    All other components within normal limits  URINALYSIS, COMPLETE (UACMP) WITH MICROSCOPIC - Abnormal; Notable for the following components:   Color, Urine YELLOW (*)    APPearance CLOUDY (*)    Leukocytes, UA LARGE (*)  Squamous Epithelial / LPF 6-30 (*)    All other components within normal limits  URINE CULTURE  CBG MONITORING, ED   CRITICAL CARE Performed by: Earleen Newport   Total critical care time: 30 minutes  Critical care time was exclusive of separately billable procedures and treating other patients.  Critical care was necessary to treat or prevent imminent or life-threatening deterioration.  Critical care was time spent personally by me on the following activities: development of treatment plan with patient and/or surrogate as well as nursing, discussions with consultants, evaluation of patient's response to treatment, examination of patient, obtaining history from patient or surrogate, ordering and performing treatments and interventions, ordering and review of laboratory studies, ordering and review of radiographic studies, pulse oximetry and re-evaluation of patient's  condition.  RADIOLOGY Images were viewed by me  CT head, chest x-ray IMPRESSION: No acute intracranial abnormality.  Remote posterior right frontal-parietal lobe infarct. Remote left lenticular nucleus/caudate infarct.  Moderate chronic microvascular changes.  IMPRESSION: Pulmonary edema with bilateral pleural effusions greater on right.  Basilar subsegmental atelectasis.  Aortic Atherosclerosis (ICD10-I70.0). ____________________________________________  DIFFERENTIAL DIAGNOSIS   Dehydration, unstable angina, MI, gastroenteritis, CVA, intracranial hemorrhage, AAA  FINAL ASSESSMENT AND PLAN  Vomiting, weakness, remote CVA, elevated troponin, altered mental status, cystitis   Plan: Patient had presented for vomiting and weakness. Patient's labs revealed mild hyponatremia and mildly elevated creatinine.  Her urine also reveals urinary tract infection.  We did begin treating her with saline infusion as well as IV Rocephin and have sent a urine culture.  Troponin is mildly elevated so we will give her an adult aspirin. Patient's imaging revealed 2 areas of remote infarcts of uncertain significance.  We will treat this with the aspirin previously given for elevated troponin.  Overall she is stable at this time.  I will discussed with the hospitalist for admission.   Earleen Newport, MD   Note: This note was generated in part or whole with voice recognition software. Voice recognition is usually quite accurate but there are transcription errors that can and very often do occur. I apologize for any typographical errors that were not detected and corrected.     Earleen Newport, MD 10/20/17 [phone]

## 2017-10-20 NOTE — Plan of Care (Signed)
  Progressing Education: Knowledge of General Education information will improve 10/20/2017 2014 - Progressing by Jeri Cos, RN Clinical Measurements: Ability to maintain clinical measurements within normal limits will improve 10/20/2017 2014 - Progressing by Jeri Cos, RN

## 2017-10-20 NOTE — ED Notes (Signed)
Patient transported to CT 

## 2017-10-20 NOTE — ED Triage Notes (Signed)
Pt arrived via EMS from home d/t sudden onset of n/v and weakness around 5am. EMS reports lethargic behavior upon arrival and in route to [facility]. Pt was given 4mg  zofran via EMS. EMS reports pt has hx of CVA, hyeprtension, and A. Fib. Pt is lethargic at this time. EMS unsure of pt baseline.

## 2017-10-20 NOTE — Progress Notes (Signed)
Family Meeting Note  Advance Directive:yes  Today a meeting took place with the Patient and daughter.   The following clinical team members were present during this meeting:MD  The following were discussed:Patient's diagnosis: UTI, Pneumonia, Pleural effusion. , Patient's progosis: Unable to determine and Goals for treatment: DNR  Additional follow-up to be provided: PMD.  Time spent during discussion:20 minutes  Vaughan Basta, MD

## 2017-10-20 NOTE — ED Notes (Signed)
Pt returned from CT at this time.  

## 2017-10-21 LAB — CBC
HEMATOCRIT: 38.9 % (ref 35.0–47.0)
HEMOGLOBIN: 12.7 g/dL (ref 12.0–16.0)
MCH: 28.1 pg (ref 26.0–34.0)
MCHC: 32.7 g/dL (ref 32.0–36.0)
MCV: 85.8 fL (ref 80.0–100.0)
Platelets: 314 10*3/uL (ref 150–440)
RBC: 4.53 MIL/uL (ref 3.80–5.20)
RDW: 15.7 % — ABNORMAL HIGH (ref 11.5–14.5)
WBC: 10.9 10*3/uL (ref 3.6–11.0)

## 2017-10-21 LAB — BASIC METABOLIC PANEL
Anion gap: 11 (ref 5–15)
BUN: 22 mg/dL — ABNORMAL HIGH (ref 6–20)
CO2: 24 mmol/L (ref 22–32)
Calcium: 9.4 mg/dL (ref 8.9–10.3)
Chloride: 97 mmol/L — ABNORMAL LOW (ref 101–111)
Creatinine, Ser: 1.21 mg/dL — ABNORMAL HIGH (ref 0.44–1.00)
GFR calc Af Amer: 44 mL/min — ABNORMAL LOW (ref >60)
GFR, EST NON AFRICAN AMERICAN: 38 mL/min — AB (ref >60)
GLUCOSE: 138 mg/dL — AB (ref 65–99)
Potassium: 3.8 mmol/L (ref 3.5–5.1)
Sodium: 132 mmol/L — ABNORMAL LOW (ref 135–145)

## 2017-10-21 LAB — LIPID PANEL
CHOL/HDL RATIO: 2.9 ratio
Cholesterol: 134 mg/dL (ref 0–200)
HDL: 46 mg/dL (ref >40)
LDL CALC: 72 mg/dL (ref 0–99)
TRIGLYCERIDES: 81 mg/dL (ref <150)
VLDL: 16 mg/dL (ref 0–40)

## 2017-10-21 LAB — URINE CULTURE: Culture: NO GROWTH

## 2017-10-21 MED ORDER — AMOXICILLIN-POT CLAVULANATE 875-125 MG PO TABS: 1.0000 | ORAL_TABLET | Freq: Two times a day (BID) | ORAL | 0 refills | Status: AC

## 2017-10-21 NOTE — Progress Notes (Signed)
Pt stated she wanted to leave the hospital AMA. Explained pt the risks and pt verbalized understanding. Dr. Manuella Ghazi at the bedside and form signed.

## 2017-10-21 NOTE — Progress Notes (Addendum)
OT Cancellation Note  Patient Details Name: Emily Moyer MRN: [id number] DOB: 07/29/1924   Cancelled Treatment:    Reason Eval/Treat Not Completed: Medical issues which prohibited therapy. Order received, chart reviewed. Upon attempt, pt with HR 110's-130's at rest in bed. Spoke with RN, recommending hold therapy evaluation at this time due to Afib. Of note, pt's daughter in room. Pt lives with dtr in 1 story home with 4 steps and bilateral handrails to enter. Pt ambulates with 2WW at baseline, 1 fall in past 12 months approx 1 month ago due to dizziness. Pt generally independent with basic ADL tasks, medication mgt (uses weekly pill organizer), and light meal prep. Daughter drives. Will continue to follow acutely and re-attempt OT evaluation at later date/time as pt is medically appropriate.   Jeni Salles, MPH, MS, OTR/L ascom [phone] 10/21/17, 8:53 AM

## 2017-10-21 NOTE — Progress Notes (Signed)
Per Dr. Manuella Ghazi okay to keep pt without PIV for now as pt keeps removing IVs.

## 2017-10-21 NOTE — Progress Notes (Signed)
After d/w patient, her daughter, Dr Weber Cooks and nursing.   Patient adamant not wanting to stay, her HR in 110s and I recommend she stay and get cardiac work up which can take another 24 hrs.  Daughter feels her confusion/agitation is due to being here in the Hospital only and otherwise she is very sharp (hard for me to believe)  I also offered placement which daughter refuses and wants to place her from home only.  She has appt with Dr Ubaldo Glassing on 2/6 and also with Dr Melrose Nakayama. I asked them to make sure they follow with their PCP and all specialists as scheduled.  She choose to leave AMA. I have sent prescription for Augmentin electronically.  Time Spent: 35 mins

## 2017-10-25 NOTE — Discharge Summary (Signed)
[place] [facility] [place] NAME: Emily Moyer    MR#:  [id number]  DATE OF BIRTH:  October 21, 1923  DATE OF ADMISSION:  10/20/2017   ADMITTING PHYSICIAN: Max Sane, MD  DATE OF DISCHARGE: 10/21/2017 12:09 PM  PRIMARY CARE PHYSICIAN: Ezequiel Kayser, MD   ADMISSION DIAGNOSIS:  Weakness [R53.1] Acute cystitis without hematuria [N30.00] Elevated troponin I level [R74.8] Non-intractable vomiting with nausea, unspecified vomiting type [R11.2] DISCHARGE DIAGNOSIS:  Active Problems:   Atrial fibrillation ([place])  SECONDARY DIAGNOSIS:   Past Medical History:  Diagnosis Date  . Arthritis   . B12 deficiency 03/18/2014  . Benign essential HTN 03/18/2014  . Benign neoplasm of skin of upper limb, including shoulder   . Breast cancer ([place]) 12/02/02   lumpectomy/rad-positive  . 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 Colon polyp   . Hypertension   . Malignant neoplasm of lower-inner quadrant of female breast ([place])    11/03/2002: T1 C., N0 invasive mammary carcinoma, ER-positive, HER-2/neu not overexpressing. Treated with wide excision and sentinel node biopsy.  . Malignant neoplasm of upper limb (HCC)    left thumb, T1, N0 treated by partial thumb amputation, sentinel node biopsy.  Marland Kitchen Neuropathy    legs  . Peripheral nerve disease 03/18/2014  . SCC (squamous cell carcinoma), arm 05/05/2013   Overview:  Overview:  Left   . Skin cancer   . Stroke [facility])    HOSPITAL COURSE:  84 y.o. female with a known history of Breast cancer, Htn, colon polyp, arthritis, stroke- lives with her daughter, able to walk around with her walker and have some early signs of memory issues but still able to take care of herself. Being admitted for  * UTI   IV ceftriaxone, pending urine culture.  * community-acquired pneumonia  - was treated with Rocephin and azithromycin.  * right-sided pleural effusion - secondary to pneumonia  * left lung mass   This is incidental finding,  but she has a history of breast cancer.   I discussed with her daughter in the room, due to her age and multiple other health issues, we can just monitor for now and I advised her to just follow up on that with her primary care physician   * hyperlipidemia   Cont statin.    After d/w patient, her daughter, Dr Weber Cooks and nursing.   Patient adamant not wanting to stay, her HR in 110s and I recommend she stay and get cardiac work up which can take another 24 hrs.  Daughter feels her confusion/agitation is due to being here in the Hospital only and otherwise she is very sharp (hard for me to believe)  I also offered placement which daughter refuses and wants to place her from home only.  She has appt with Dr Ubaldo Glassing on 2/6 and also with Dr Melrose Nakayama. I asked them to make sure they follow with their PCP and all specialists as scheduled.  She choose to leave AMA. I have sent prescription for Augmentin electronically. DISCHARGE CONDITIONS:  fair CONSULTS OBTAINED:   DRUG ALLERGIES:   Allergies  Allergen Reactions  . Aspirin-Dipyridamole Er     Other reaction(s): Unknown  . Dipyridamole     Other reaction(s): Unknown  . Lyrica [Pregabalin] Swelling  . Clonidine Other (See Comments)    Dry mouth  . Cymbalta  [Duloxetine Hcl]     Other reaction(s): Other (See Comments) insomnia  . Duloxetine     Other reaction(s): Other (See Comments) insomnia  DISCHARGE MEDICATIONS:   Allergies as of 10/21/2017      Reactions   Aspirin-dipyridamole Er    Other reaction(s): Unknown   Dipyridamole    Other reaction(s): Unknown   Lyrica [pregabalin] Swelling   Clonidine Other (See Comments)   Dry mouth   Cymbalta  [duloxetine Hcl]    Other reaction(s): Other (See Comments) insomnia   Duloxetine    Other reaction(s): Other (See Comments) insomnia      Medication List    TAKE these medications   amoxicillin-clavulanate 875-125 MG tablet Commonly known as:  AUGMENTIN Take 1 tablet  by mouth every 12 (twelve) hours for 7 days.     ASK your doctor about these medications   amLODipine 10 MG tablet Commonly known as:  NORVASC Take 10 mg by mouth daily.   aspirin 81 MG tablet Take 81 mg by mouth daily.   atorvastatin 40 MG tablet Commonly known as:  LIPITOR Take 1 tablet by mouth daily.   ciprofloxacin 250 MG tablet Commonly known as:  CIPRO Take 1 tablet (250 mg total) by mouth every 12 (twelve) hours.   Fish Oil 1200 MG Caps Take 1 capsule by mouth daily.   lisinopril-hydrochlorothiazide 20-12.5 MG tablet Commonly known as:  PRINZIDE,ZESTORETIC Take 2 tablets by mouth daily.   metoprolol succinate 100 MG 24 hr tablet Commonly known as:  TOPROL-XL Take 1 tablet by mouth daily.   pregabalin 50 MG capsule Commonly known as:  LYRICA Take 50 mg by mouth 2 (two) times daily.   traMADol 50 MG tablet Commonly known as:  ULTRAM Take 50 mg by mouth every 6 (six) hours as needed.   traZODone 50 MG tablet Commonly known as:  DESYREL Take 50 mg by mouth at bedtime.   vitamin B-12 500 MCG tablet Commonly known as:  CYANOCOBALAMIN Take 500 mcg by mouth daily.   Vitamin D-3 1000 units Caps Take 1 capsule by mouth daily.        DISCHARGE INSTRUCTIONS:   DIET:  Regular diet DISCHARGE CONDITION:  Good ACTIVITY:  Activity as tolerated OXYGEN:  Home Oxygen: No.  Oxygen Delivery: room air DISCHARGE LOCATION:  home   If you experience worsening of your admission symptoms, develop shortness of breath, life threatening emergency, suicidal or homicidal thoughts you must seek medical attention immediately by calling 911 or calling your MD immediately  if symptoms less severe.  You Must read complete instructions/literature along with all the possible adverse reactions/side effects for all the Medicines you take and that have been prescribed to you. Take any new Medicines after you have completely understood and accpet all the possible adverse  reactions/side effects.   Please note  You were cared for by a hospitalist during your hospital stay. If you have any questions about your discharge medications or the care you received while you were in the hospital after you are discharged, you can call the unit and asked to speak with the hospitalist on call if the hospitalist that took care of you is not available. Once you are discharged, your primary care physician will handle any further medical issues. Please note that NO REFILLS for any discharge medications will be authorized once you are discharged, as it is imperative that you return to your primary care physician (or establish a relationship with a primary care physician if you do not have one) for your aftercare needs so that they can reassess your need for medications and monitor your lab values.    On the  day of Discharge:  VITAL SIGNS:  Blood pressure 120/60, pulse (!) 109, temperature 98.2 F (36.8 C), temperature source Oral, resp. rate 18, height _0  (1.676 m), weight 70.8 kg (156 lb), SpO2 94 %. PHYSICAL EXAMINATION:  GENERAL:  84 y.o.-year-old patient lying in the bed with no acute distress.  EYES: Pupils equal, round, reactive to light and accommodation. No scleral icterus. Extraocular muscles intact.  HEENT: Head atraumatic, normocephalic. Oropharynx and nasopharynx clear.  NECK:  Supple, no jugular venous distention. No thyroid enlargement, no tenderness.  LUNGS: Normal breath sounds bilaterally, no wheezing, rales,rhonchi or crepitation. No use of accessory muscles of respiration.  CARDIOVASCULAR: S1, S2 normal. No murmurs, rubs, or gallops.  ABDOMEN: Soft, non-tender, non-distended. Bowel sounds present. No organomegaly or mass.  EXTREMITIES: No pedal edema, cyanosis, or clubbing.  NEUROLOGIC: Cranial nerves II through XII are intact. Muscle strength 5/5 in all extremities. Sensation intact. Gait not checked.  PSYCHIATRIC: The patient is alert and oriented x 3.    SKIN: No obvious rash, lesion, or ulcer.  DATA REVIEW:   CBC Recent Labs  Lab 10/21/17 0420  WBC 10.9  HGB 12.7  HCT 38.9  PLT 314    Chemistries  Recent Labs  Lab 10/20/17 0657 10/21/17 0420  NA 131* 132*  K 3.4* 3.8  CL 96* 97*  CO2 22 24  GLUCOSE 211* 138*  BUN 21* 22*  CREATININE 1.35* 1.21*  CALCIUM 9.1 9.4  AST 26  --   ALT 14  --   ALKPHOS 115  --   BILITOT 0.7  --      Microbiology Results   No results found.   Management plans discussed with the patient, family (daughter at bedside) and they are in agreement.  CODE STATUS: Prior   TOTAL TIME TAKING CARE OF THIS PATIENT: 45 minutes.    Max Sane M.D on 10/25/2017 at 12:24 PM  Between 7am to 6pm - Pager - [phone]  After 6pm go to [email or web address] - [employer] [facility]  Office  [phone]  CC: Primary care physician; Ezequiel Kayser, MD   Note: This dictation was prepared with Dragon dictation along with smaller phrase technology. Any transcriptional errors that result from this process are unintentional.

## 2017-10-31 ENCOUNTER — Inpatient Hospital Stay: Payer: Medicare Other

## 2017-10-31 ENCOUNTER — Emergency Department: Payer: Medicare Other

## 2017-10-31 ENCOUNTER — Other Ambulatory Visit: Payer: Self-pay

## 2017-10-31 ENCOUNTER — Inpatient Hospital Stay
Admission: EM | Admit: 2017-10-31 | Discharge: 2017-11-09 | DRG: 064 | Disposition: A | Discharge status: Hospice | Payer: Medicare Other | Attending: Internal Medicine | Admitting: Internal Medicine

## 2017-10-31 ENCOUNTER — Encounter: Payer: Self-pay | Admitting: Emergency Medicine

## 2017-10-31 DIAGNOSIS — E43 Unspecified severe protein-calorie malnutrition: Secondary | ICD-10-CM | POA: Diagnosis not present

## 2017-10-31 DIAGNOSIS — R402353 Coma scale, best motor response, localizes pain, at hospital admission: Secondary | ICD-10-CM | POA: Diagnosis present

## 2017-10-31 DIAGNOSIS — E538 Deficiency of other specified B group vitamins: Secondary | ICD-10-CM | POA: Diagnosis present

## 2017-10-31 DIAGNOSIS — I6340 Cerebral infarction due to embolism of unspecified cerebral artery: Secondary | ICD-10-CM | POA: Diagnosis not present

## 2017-10-31 DIAGNOSIS — I481 Persistent atrial fibrillation: Secondary | ICD-10-CM | POA: Diagnosis present

## 2017-10-31 DIAGNOSIS — R29810 Facial weakness: Secondary | ICD-10-CM | POA: Diagnosis present

## 2017-10-31 DIAGNOSIS — G9341 Metabolic encephalopathy: Secondary | ICD-10-CM | POA: Diagnosis present

## 2017-10-31 DIAGNOSIS — I639 Cerebral infarction, unspecified: Secondary | ICD-10-CM | POA: Diagnosis not present

## 2017-10-31 DIAGNOSIS — I4891 Unspecified atrial fibrillation: Secondary | ICD-10-CM

## 2017-10-31 DIAGNOSIS — Z66 Do not resuscitate: Secondary | ICD-10-CM | POA: Diagnosis not present

## 2017-10-31 DIAGNOSIS — E0942 Drug or chemical induced diabetes mellitus with neurological complications with diabetic polyneuropathy: Secondary | ICD-10-CM | POA: Diagnosis present

## 2017-10-31 DIAGNOSIS — I482 Chronic atrial fibrillation: Secondary | ICD-10-CM | POA: Diagnosis present

## 2017-10-31 DIAGNOSIS — R414 Neurologic neglect syndrome: Secondary | ICD-10-CM | POA: Diagnosis present

## 2017-10-31 DIAGNOSIS — E861 Hypovolemia: Secondary | ICD-10-CM | POA: Diagnosis not present

## 2017-10-31 DIAGNOSIS — R4182 Altered mental status, unspecified: Secondary | ICD-10-CM | POA: Diagnosis present

## 2017-10-31 DIAGNOSIS — I130 Hypertensive heart and chronic kidney disease with heart failure and stage 1 through stage 4 chronic kidney disease, or unspecified chronic kidney disease: Secondary | ICD-10-CM | POA: Diagnosis present

## 2017-10-31 DIAGNOSIS — Z79899 Other long term (current) drug therapy: Secondary | ICD-10-CM

## 2017-10-31 DIAGNOSIS — I63412 Cerebral infarction due to embolism of left middle cerebral artery: Secondary | ICD-10-CM | POA: Diagnosis not present

## 2017-10-31 DIAGNOSIS — I5032 Chronic diastolic (congestive) heart failure: Secondary | ICD-10-CM | POA: Diagnosis present

## 2017-10-31 DIAGNOSIS — Z7982 Long term (current) use of aspirin: Secondary | ICD-10-CM

## 2017-10-31 DIAGNOSIS — Z515 Encounter for palliative care: Secondary | ICD-10-CM | POA: Diagnosis not present

## 2017-10-31 DIAGNOSIS — Z6822 Body mass index (BMI) 22.0-22.9, adult: Secondary | ICD-10-CM

## 2017-10-31 DIAGNOSIS — R402143 Coma scale, eyes open, spontaneous, at hospital admission: Secondary | ICD-10-CM | POA: Diagnosis present

## 2017-10-31 DIAGNOSIS — Z886 Allergy status to analgesic agent status: Secondary | ICD-10-CM

## 2017-10-31 DIAGNOSIS — R4781 Slurred speech: Secondary | ICD-10-CM | POA: Diagnosis present

## 2017-10-31 DIAGNOSIS — F17290 Nicotine dependence, other tobacco product, uncomplicated: Secondary | ICD-10-CM | POA: Diagnosis present

## 2017-10-31 DIAGNOSIS — R29703 NIHSS score 3: Secondary | ICD-10-CM | POA: Diagnosis present

## 2017-10-31 DIAGNOSIS — Z9119 Patient's noncompliance with other medical treatment and regimen: Secondary | ICD-10-CM

## 2017-10-31 DIAGNOSIS — I6389 Other cerebral infarction: Secondary | ICD-10-CM | POA: Diagnosis not present

## 2017-10-31 DIAGNOSIS — E785 Hyperlipidemia, unspecified: Secondary | ICD-10-CM | POA: Diagnosis present

## 2017-10-31 DIAGNOSIS — R402243 Coma scale, best verbal response, confused conversation, at hospital admission: Secondary | ICD-10-CM | POA: Diagnosis present

## 2017-10-31 DIAGNOSIS — Z9181 History of falling: Secondary | ICD-10-CM

## 2017-10-31 DIAGNOSIS — Z9114 Patient's other noncompliance with medication regimen: Secondary | ICD-10-CM

## 2017-10-31 DIAGNOSIS — N182 Chronic kidney disease, stage 2 (mild): Secondary | ICD-10-CM | POA: Diagnosis present

## 2017-10-31 DIAGNOSIS — Z888 Allergy status to other drugs, medicaments and biological substances status: Secondary | ICD-10-CM

## 2017-10-31 DIAGNOSIS — Z7189 Other specified counseling: Secondary | ICD-10-CM | POA: Diagnosis not present

## 2017-10-31 DIAGNOSIS — Z8673 Personal history of transient ischemic attack (TIA), and cerebral infarction without residual deficits: Secondary | ICD-10-CM

## 2017-10-31 DIAGNOSIS — R0602 Shortness of breath: Secondary | ICD-10-CM

## 2017-10-31 LAB — URINE DRUG SCREEN, QUALITATIVE (ARMC ONLY)
Amphetamines, Ur Screen: NOT DETECTED
BARBITURATES, UR SCREEN: NOT DETECTED
Benzodiazepine, Ur Scrn: NOT DETECTED
CANNABINOID 50 NG, UR ~~LOC~~: NOT DETECTED
Cocaine Metabolite,Ur ~~LOC~~: NOT DETECTED
MDMA (Ecstasy)Ur Screen: NOT DETECTED
Methadone Scn, Ur: NOT DETECTED
Opiate, Ur Screen: NOT DETECTED
Phencyclidine (PCP) Ur S: NOT DETECTED
TRICYCLIC, UR SCREEN: NOT DETECTED

## 2017-10-31 LAB — URINALYSIS, ROUTINE W REFLEX MICROSCOPIC
BILIRUBIN URINE: NEGATIVE
Glucose, UA: NEGATIVE mg/dL
Hgb urine dipstick: NEGATIVE
KETONES UR: NEGATIVE mg/dL
Leukocytes, UA: NEGATIVE
Nitrite: NEGATIVE
PH: 5 (ref 5.0–8.0)
Protein, ur: NEGATIVE mg/dL
Specific Gravity, Urine: 1.015 (ref 1.005–1.030)

## 2017-10-31 LAB — TROPONIN I: Troponin I: <0.03 ng/mL (ref <0.03)

## 2017-10-31 LAB — PROTIME-INR
INR: 1.01
INR: 1.03
PROTHROMBIN TIME: 13.2 s (ref 11.4–15.2)
Prothrombin Time: 13.4 seconds (ref 11.4–15.2)

## 2017-10-31 LAB — COMPREHENSIVE METABOLIC PANEL
ALK PHOS: 135 U/L — AB (ref 38–126)
ALT: 49 U/L (ref 14–54)
AST: 46 U/L — AB (ref 15–41)
Albumin: 3.5 g/dL (ref 3.5–5.0)
Anion gap: 14 (ref 5–15)
BILIRUBIN TOTAL: 1 mg/dL (ref 0.3–1.2)
BUN: 29 mg/dL — AB (ref 6–20)
CALCIUM: 9.6 mg/dL (ref 8.9–10.3)
CO2: 23 mmol/L (ref 22–32)
CREATININE: 1.53 mg/dL — AB (ref 0.44–1.00)
Chloride: 99 mmol/L — ABNORMAL LOW (ref 101–111)
GFR, EST AFRICAN AMERICAN: 33 mL/min — AB (ref >60)
GFR, EST NON AFRICAN AMERICAN: 29 mL/min — AB (ref >60)
Glucose, Bld: 147 mg/dL — ABNORMAL HIGH (ref 65–99)
Potassium: 3.7 mmol/L (ref 3.5–5.1)
Sodium: 136 mmol/L (ref 135–145)
TOTAL PROTEIN: 6.9 g/dL (ref 6.5–8.1)

## 2017-10-31 LAB — GLUCOSE, CAPILLARY: GLUCOSE-CAPILLARY: 135 mg/dL — AB (ref 65–99)

## 2017-10-31 LAB — CBC
HCT: 37.9 % (ref 35.0–47.0)
Hemoglobin: 12.3 g/dL (ref 12.0–16.0)
MCH: 27.9 pg (ref 26.0–34.0)
MCHC: 32.5 g/dL (ref 32.0–36.0)
MCV: 86 fL (ref 80.0–100.0)
PLATELETS: 328 10*3/uL (ref 150–440)
RBC: 4.41 MIL/uL (ref 3.80–5.20)
RDW: 16.3 % — AB (ref 11.5–14.5)
WBC: 13 10*3/uL — AB (ref 3.6–11.0)

## 2017-10-31 LAB — APTT
aPTT: 29 seconds (ref 24–36)
aPTT: 27 seconds (ref 24–36)

## 2017-10-31 MED ORDER — DIGOXIN 0.1 MG/ML IJ SOLN
0.1250 mg | Freq: Once | INTRAMUSCULAR | Status: DC
  Filled 2017-10-31: qty 2

## 2017-10-31 MED ORDER — DIGOXIN 0.25 MG/ML IJ SOLN
0.1250 mg | Freq: Once | INTRAMUSCULAR | Status: AC
  Administered 2017-10-31: 0.125 mg via INTRAVENOUS
  Filled 2017-10-31: qty 0.5

## 2017-10-31 MED ORDER — ASPIRIN 300 MG RE SUPP
300.0000 mg | Freq: Once | RECTAL | Status: AC
  Administered 2017-10-31: 300 mg via RECTAL
  Filled 2017-10-31: qty 1

## 2017-10-31 MED ORDER — MAGNESIUM SULFATE 2 GM/50ML IV SOLN
2.0000 g | Freq: Once | INTRAVENOUS | Status: AC
  Administered 2017-10-31: 2 g via INTRAVENOUS
  Filled 2017-10-31: qty 50

## 2017-10-31 MED ORDER — ORAL CARE MOUTH RINSE
15.0000 mL | Freq: Two times a day (BID) | OROMUCOSAL | Status: DC
  Administered 2017-11-01: 15 mL via OROMUCOSAL

## 2017-10-31 MED ORDER — LORAZEPAM 2 MG/ML IJ SOLN
2.0000 mg | Freq: Once | INTRAMUSCULAR | Status: DC
  Filled 2017-10-31: qty 1

## 2017-10-31 MED ORDER — STROKE: EARLY STAGES OF RECOVERY BOOK: Freq: Once | Status: DC

## 2017-10-31 MED ORDER — DILTIAZEM HCL 100 MG IV SOLR
5.0000 mg/h | INTRAVENOUS | Status: DC
  Administered 2017-10-31: 5 mg/h via INTRAVENOUS
  Administered 2017-10-31 – 2017-11-03 (×8): 15 mg/h via INTRAVENOUS
  Filled 2017-10-31 (×9): qty 100

## 2017-10-31 MED ORDER — FUROSEMIDE 10 MG/ML IJ SOLN
40.0000 mg | Freq: Once | INTRAMUSCULAR | Status: AC
  Administered 2017-10-31: 40 mg via INTRAVENOUS
  Filled 2017-10-31: qty 4

## 2017-10-31 MED ORDER — ACETAMINOPHEN 650 MG RE SUPP: 650.0000 mg | RECTAL | Status: DC | PRN

## 2017-10-31 MED ORDER — ENOXAPARIN SODIUM 30 MG/0.3ML ~~LOC~~ SOLN
30.0000 mg | SUBCUTANEOUS | Status: DC
  Administered 2017-11-01 – 2017-11-02 (×2): 30 mg via SUBCUTANEOUS
  Filled 2017-10-31 (×2): qty 0.3

## 2017-10-31 MED ORDER — SODIUM CHLORIDE 0.9 % IV BOLUS (SEPSIS)
1000.0000 mL | Freq: Once | INTRAVENOUS | Status: AC
  Administered 2017-10-31: 1000 mL via INTRAVENOUS

## 2017-10-31 MED ORDER — ACETAMINOPHEN 160 MG/5ML PO SOLN
650.0000 mg | ORAL | Status: DC | PRN
  Filled 2017-10-31: qty 20.3

## 2017-10-31 MED ORDER — SODIUM CHLORIDE 0.9 % IV SOLN: INTRAVENOUS | Status: DC

## 2017-10-31 MED ORDER — HALOPERIDOL LACTATE 5 MG/ML IJ SOLN
1.0000 mg | Freq: Four times a day (QID) | INTRAMUSCULAR | Status: DC | PRN
  Administered 2017-10-31 – 2017-11-09 (×2): 1 mg via INTRAVENOUS
  Filled 2017-10-31 (×2): qty 1

## 2017-10-31 MED ORDER — ACETAMINOPHEN 325 MG PO TABS: 650.0000 mg | ORAL_TABLET | ORAL | Status: DC | PRN

## 2017-10-31 MED ORDER — DIAZEPAM 5 MG/ML IJ SOLN: 2.5000 mg | Freq: Once | INTRAMUSCULAR | Status: DC

## 2017-10-31 NOTE — ED Notes (Signed)
MRI called and asked if pt had been premedicated for MRI, MRI tech informed that pt had been given 1 mg IV haldol. Per MRI pt is not cooperating with MRI. Will call pharmacy and request that IV valium be sent to ED.

## 2017-10-31 NOTE — ED Notes (Signed)
Called and spoke with patient placement, they will assign pt bed on tele floor

## 2017-10-31 NOTE — ED Notes (Signed)
Spoke with Dr. Posey Pronto, informed him pt heart rate still 120's-130 bpm, per Dr. Henderson Cloud to increased dose of cardizem drip early

## 2017-10-31 NOTE — ED Notes (Signed)
Pt dtr spoke with MRI tech to screen patient for MRI. IV valium not available in ED pyxis, pt will be medicated with PRN haldol.  Pt facial droop appears to be worsening. MRI informed.

## 2017-10-31 NOTE — ED Notes (Signed)
Called ICU to give report, ICU charge nurse asked that we call back in 15 minutes

## 2017-10-31 NOTE — ED Notes (Signed)
Pt O2 titrated down to 3 liter via , pt maintaining O2 sat at 94%

## 2017-10-31 NOTE — ED Triage Notes (Signed)
Pt arrived via EMS d/t AMS for a couple days per daughter. EMS reports that saughter stated pt had right sided gaze since this morning. Pedal edema per daughter.  Pt was here a couple weeks ago for UTI, N/V. Pt non compliant with her medications for A. Fib.

## 2017-10-31 NOTE — Progress Notes (Signed)
Initiated Oncologist for patient safety.

## 2017-10-31 NOTE — ED Notes (Addendum)
Attempted to call report again to ICU per Coralyn Mark, there is no one to take report but I can bring pt and wait in room for someone to come get report

## 2017-10-31 NOTE — Progress Notes (Signed)
Advanced care plan.  Purpose of the Encounter: CODE STATUS  Parties in [place] and daugher  Patient's Decision Capacity:not intact at this point  Subjective/Patient's story: Pt with acute stroke and atrial fibrillation with RVR Recurrent admission within the past week   Objective/Medical story I discussed with the patient and her daughter regarding [place] with her advanced age recommended to DO NOT RESUSCITATE   Goals of care determination:  DNR   CODE STATUS: dNR   Time spent discussing advanced care planning: 16 minutes

## 2017-10-31 NOTE — H&P (Signed)
[facility] NAME: Emily Moyer    MR#:  [id number]  Moyer OF BIRTH:  1924/06/26  Moyer OF ADMISSION:  10/31/2017  PRIMARY CARE PHYSICIAN: Ezequiel Kayser, MD   REQUESTING/REFERRING PHYSICIAN:   CHIEF COMPLAINT:   Chief Complaint  Patient presents with  . Altered Mental Status    HISTORY OF PRESENT ILLNESS: Emily Moyer  is a 84 y.o. female with a known history of chronic atrial fibrillation only on aspirin due to falls, essential hypertension previous CVA who was actually hospitalized here recently with a UTI and pneumonia who left AGAINST MEDICAL ADVICE recently.  She is brought in by her daughter because she was found sitting on the side of her bed with right-sided gaze deviation, slurred speech and left-sided neglect by her daughter.  Patient does have a slurred speech she wants to go home.  According to her daughter her mother is noncompliant with all her medications other than aspirin.      PAST MEDICAL HISTORY:   Past Medical History:  Diagnosis Moyer  . Arthritis   . B12 deficiency 03/18/2014  . Benign essential HTN 03/18/2014  . Benign neoplasm of skin of upper limb, including shoulder   . Breast cancer ([place]) 12/02/02   lumpectomy/rad-positive  . 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 Colon polyp   . Hypertension   . Malignant neoplasm of lower-inner quadrant of female breast ([place])    11/03/2002: T1 C., N0 invasive mammary carcinoma, ER-positive, HER-2/neu not overexpressing. Treated with wide excision and sentinel node biopsy.  . Malignant neoplasm of upper limb ([facility])    left thumb, T1, N0 treated by partial thumb amputation, sentinel node biopsy.  Marland Kitchen Neuropathy    legs  . Peripheral nerve disease 03/18/2014  . SCC (squamous cell carcinoma), arm 05/05/2013   Overview:  Overview:  Left   . Skin cancer   . Stroke [facility])     PAST SURGICAL HISTORY:  Past Surgical History:  Procedure Laterality Moyer  . ABDOMINAL  HYSTERECTOMY    . biopsy thumb Left 2013    0.4 x 1.8 x 1.8 cm histologic grade 2 invasive squamous cell. 7 mm from in situ carcinoma to resection, 1.2 cm from invasive cancer. Negative margins.pT1,N0  . BREAST BIOPSY Right 10/23/02   positive  . BREAST LUMPECTOMY Right 2004  . CHOLECYSTECTOMY    . COLONOSCOPY  2008  . COLONOSCOPY W/ POLYPECTOMY  06/23/2006 transverse colon tubular adenoma without high-grade dysplasia.  Marland Kitchen THUMB AMPUTATION Left 04/19/2012   Squamous cell carcinoma    SOCIAL HISTORY:  Social History   Tobacco Use  . Smoking status: Never Smoker  . Smokeless tobacco: Current User    Types: Snuff  . Tobacco comment: used 35 years  Substance Use Topics  . Alcohol use: No    FAMILY HISTORY:  Family History  Problem Relation Age of Onset  . Healthy Mother   . Healthy Father   . Dementia Sister   . Breast cancer Neg Hx     DRUG ALLERGIES:  Allergies  Allergen Reactions  . Aspirin-Dipyridamole Er     Other reaction(s): Unknown  . Dipyridamole     Other reaction(s): Unknown  . Lyrica [Pregabalin] Swelling  . Clonidine Other (See Comments)    Dry mouth  . Cymbalta  [Duloxetine Hcl]     Other reaction(s): Other (See Comments) insomnia  . Duloxetine     Other reaction(s): Other (See Comments) insomnia    REVIEW OF SYSTEMS:  CONSTITUTIONAL: Limited due to patient not able to answer all the questions  MEDICATIONS AT HOME:  Prior to Admission medications   Medication Sig Start Moyer End Moyer Taking? Authorizing Provider  amLODipine (NORVASC) 10 MG tablet Take 10 mg by mouth daily.     [provider]  aspirin 81 MG tablet Take 81 mg by mouth daily.    [provider]  atorvastatin (LIPITOR) 40 MG tablet Take 1 tablet by mouth daily. 04/16/13   [provider]  Cholecalciferol (VITAMIN D-3) 1000 UNITS CAPS Take 1 capsule by mouth daily.    [provider]  ciprofloxacin (CIPRO) 250 MG tablet Take 1 tablet (250 mg total)  by mouth every 12 (twelve) hours. Patient not taking: Reported on 10/20/2017 10/02/17   Coral Spikes, DO  lisinopril-hydrochlorothiazide (PRINZIDE,ZESTORETIC) 20-12.5 MG per tablet Take 2 tablets by mouth daily.  04/16/13   [provider]  metoprolol succinate (TOPROL-XL) 100 MG 24 hr tablet Take 1 tablet by mouth daily. 04/30/13   [provider]  Omega-3 Fatty Acids (FISH OIL) 1200 MG CAPS Take 1 capsule by mouth daily.    [provider]  pregabalin (LYRICA) 50 MG capsule Take 50 mg by mouth 2 (two) times daily.    [provider]  traMADol (ULTRAM) 50 MG tablet Take 50 mg by mouth every 6 (six) hours as needed.  06/17/17   [provider]  traZODone (DESYREL) 50 MG tablet Take 50 mg by mouth at bedtime.    [provider]  vitamin B-12 (CYANOCOBALAMIN) 500 MCG tablet Take 500 mcg by mouth daily.    [provider]      PHYSICAL EXAMINATION:   VITAL SIGNS: Blood pressure 128/67, pulse 98, resp. rate 18, SpO2 96 %.  GENERAL:  84 y.o.-year-old patient lying in the bed with no acute distress.  EYES: Pupils equal, round, reactive to light and accommodation. No scleral icterus.  HEENT: Head atraumatic, normocephalic. Oropharynx and nasopharynx clear.  NECK:  Supple, no jugular venous distention. No thyroid enlargement, no tenderness.  LUNGS: Normal breath sounds bilaterally, no wheezing, rales,rhonchi or crepitation. No use of accessory muscles of respiration.  CARDIOVASCULAR: Irregularly irregular no murmurs, rubs, or gallops.  ABDOMEN: Soft, nontender, nondistended. Bowel sounds present. No organomegaly or mass.  EXTREMITIES: No pedal edema, cyanosis, or clubbing.  NEUROLOGIC: Right-sided gaze deviation she is able to squeeze my hands without any difficulty in both hands.  Moving her legs exam is limited due to her not following all commands PSYCHIATRIC: The patient is alert and oriented x 2.  SKIN: No obvious rash, lesion, or  ulcer.   LABORATORY PANEL:   CBC Recent Labs  Lab 10/31/17 1036  WBC 13.0*  HGB 12.3  HCT 37.9  PLT 328  MCV 86.0  MCH 27.9  MCHC 32.5  RDW 16.3*   ------------------------------------------------------------------------------------------------------------------  Chemistries  Recent Labs  Lab 10/31/17 1036  NA 136  K 3.7  CL 99*  CO2 23  GLUCOSE 147*  BUN 29*  CREATININE 1.53*  CALCIUM 9.6  AST 46*  ALT 49  ALKPHOS 135*  BILITOT 1.0   ------------------------------------------------------------------------------------------------------------------ estimated creatinine clearance is 22.4 mL/min (A) (by C-G formula based on SCr of 1.53 mg/dL (H)). ------------------------------------------------------------------------------------------------------------------ No results for input(s): TSH, T4TOTAL, T3FREE, THYROIDAB in the last 72 hours.  Invalid input(s): FREET3   Coagulation profile Recent Labs  Lab 10/31/17 1036  INR 1.01   ------------------------------------------------------------------------------------------------------------------- No results for input(s): DDIMER in the last 72 hours. -------------------------------------------------------------------------------------------------------------------  Cardiac Enzymes Recent Labs  Lab 10/31/17 1036  TROPONINI <0.03   ------------------------------------------------------------------------------------------------------------------ Invalid input(s): POCBNP  ---------------------------------------------------------------------------------------------------------------  Urinalysis    Component Value Moyer/Time   COLORURINE YELLOW (A) 10/31/2017 1036   APPEARANCEUR CLEAR (A) 10/31/2017 1036   APPEARANCEUR Clear 10/02/2015 1051   LABSPEC 1.015 10/31/2017 1036   LABSPEC 1.008 04/12/2012 1058   PHURINE 5.0 10/31/2017 1036   GLUCOSEU NEGATIVE 10/31/2017 1036   GLUCOSEU Negative 04/12/2012 1058    HGBUR NEGATIVE 10/31/2017 1036   BILIRUBINUR NEGATIVE 10/31/2017 1036   BILIRUBINUR Negative 10/02/2015 1051   BILIRUBINUR Negative 04/12/2012 1058   KETONESUR NEGATIVE 10/31/2017 1036   PROTEINUR NEGATIVE 10/31/2017 1036   NITRITE NEGATIVE 10/31/2017 1036   LEUKOCYTESUR NEGATIVE 10/31/2017 1036   LEUKOCYTESUR Negative 10/02/2015 1051   LEUKOCYTESUR Negative 04/12/2012 1058     RADIOLOGY: Ct Head Wo Contrast  Result Moyer: 10/31/2017 CLINICAL DATA:  Altered mental status. EXAM: CT HEAD WITHOUT CONTRAST TECHNIQUE: Contiguous axial images were obtained from the base of the skull through the vertex without intravenous contrast. COMPARISON:  10/20/2017 FINDINGS: Brain: Old right parietal infarct again noted, unchanged. There is atrophy and chronic small vessel disease changes. No acute intracranial abnormality. Specifically, no hemorrhage, hydrocephalus, mass lesion, acute infarction, or significant intracranial injury. Vascular: No hyperdense vessel or unexpected calcification. Skull: No acute calvarial abnormality. Sinuses/Orbits: Visualized paranasal sinuses and mastoids clear. Orbital soft tissues unremarkable. Other: None IMPRESSION: No acute intracranial abnormality. Atrophy, chronic microvascular disease. Old right parietal infarct. Electronically Signed   By: Rolm Baptise M.D.   On: 10/31/2017 11:03    EKG: Orders placed or performed during the hospital encounter of 10/31/17  . EKG 12-Lead  . EKG 12-Lead  . ED EKG  . ED EKG    IMPRESSION AND PLAN: Patient is a 84 year old white female with history of atrial fibrillation presenting with strokelike symptoms  1.  Acute CVA We will admit to telemetry due to A. fib with RVR Obtain neurology consult Continue aspirin Due to fall risk she is not a good candidate for full dose anticoagulation Lipid panel in the morning Swallow eval MRI of the brain/MRA of the brain: We will try to obtain these with premedication if patient will be  compliant We will do carotid Dopplers Echo of the heart  2.  A. fib with RVR patient will be started on Cardizem drip Cardiology consult  3.  Essential hypertension we will allow permissive hypertension hold blood pressure medications continue Cardizem drip  4.  Medical noncompliance previous AGAINST MEDICAL ADVICE DOS daughter concerned about her ability to make decisions I will ask psych to come see the patient  5.  CODE STATUS discussed with the daughter the patient was previously DNR however no documentation I have discussed again regarding code they have agreed to a DNR status  All the records are reviewed and case discussed with ED provider. Management plans discussed with the patient, family and they are in agreement.  CODE STATUS: Code Status History    Moyer Active Moyer Inactive Code Status Order ID Comments User Context   10/20/2017 17:47 10/21/2017 15:15 DNR [id number]  Vaughan Basta, MD Inpatient   10/20/2017 16:57 10/20/2017 17:47 Full Code [id number]  Max Sane, MD Inpatient   10/20/2017 14:40 10/20/2017 16:56 DNR [id number]  Vaughan Basta, MD ED    Questions for Most Recent Historical Code Status (Order [id number])    Question Answer Comment   In the event of cardiac or respiratory ARREST Do not call a "code blue"  In the event of cardiac or respiratory ARREST Do not perform Intubation, CPR, defibrillation or ACLS    In the event of cardiac or respiratory ARREST Use medication by any route, position, wound care, and other measures to relive pain and suffering. May use oxygen, suction and manual treatment of airway obstruction as needed for comfort.        TOTAL TIME TAKING CARE OF THIS PATIENT: 55 minutes.    Dustin Flock M.D on 10/31/2017 at 12:13 PM  Between 7am to 6pm - Pager - [phone]  After 6pm go to [email or web address] - password [email or web address] [facility]  [place] [facility]  Office  [phone]  CC: Primary care physician; Ezequiel Kayser,  MD

## 2017-10-31 NOTE — ED Notes (Signed)
Attempted to call pt daughter, [name], left voice mail

## 2017-10-31 NOTE — ED Notes (Signed)
Pt transported to ultrasound.

## 2017-10-31 NOTE — ED Notes (Signed)
Called pharmacy and requested that IV valium be sent to ED for patient, per pharmacy IV valium is on back order and has been for several years.  Dr. Posey Pronto paged to get order for different medication

## 2017-10-31 NOTE — ED Notes (Signed)
Called MRI and informed tech that we are still waiting for Dr. Posey Pronto to call back and give different medication order for sedation since IV valium is on back order

## 2017-10-31 NOTE — ED Notes (Signed)
Spoke with Emily Moyer on 2A, informed her that IV valium was on back order and that we were trying to get in touch with Dr. Posey Pronto so that we can get an order for another medication to sedate patient. Emily Moyer states that she will try to page Dr. Posey Pronto and get order for medication

## 2017-10-31 NOTE — Progress Notes (Signed)
Patient had her carotid Dopplers and we were trying to do the MRI however she was very restless so had to receive 2 mg of haloperidol.  Patient started getting more agitated.  And then started having low oxygen sats.  MRI was canceled.  Now she is on 6 L of oxygen heart rate is still elevated.  Patient will need to go to stepdown I have spoken to the intensivist regarding her transfer. Patient is a DNR.  May need to reattempt MRI tomorrow.

## 2017-10-31 NOTE — ED Notes (Signed)
Spoke with Alanya on 2A, Dr. Posey Pronto called her back and gave order for medication for sedation for MRI.

## 2017-10-31 NOTE — ED Notes (Signed)
Vital signs rechecked, Pt O2 sat 83% on room air, pt was placed on 6 liters O2 with sats coming up to 91%. Dr. Posey Pronto aware and at patient bedside. Will change admit order for ICU bed.

## 2017-10-31 NOTE — ED Notes (Signed)
Per Arvella Merles they are still cleaning bed but should be ready soon

## 2017-10-31 NOTE — Progress Notes (Signed)
PHARMACIST - PHYSICIAN COMMUNICATION  CONCERNING:  Enoxaparin (Lovenox) for DVT Prophylaxis    RECOMMENDATION: Patient was prescribed enoxaprin 40mg  q24 hours for VTE prophylaxis.   There were no vitals filed for this visit.  There is no height or weight on file to calculate BMI.  Estimated Creatinine Clearance: 22.4 mL/min (A) (by C-G formula based on SCr of 1.53 mg/dL (H)).  Patient is candidate for enoxaparin 30mg  every 24 hours based on CrCl <56ml/min  DESCRIPTION: Pharmacy has adjusted enoxaparin dose.  Patient is now receiving enoxaparin 30mg  every 24 hours.  Pernell Dupre, PharmD, BCPS Clinical Pharmacist 10/31/2017 6:21 PM

## 2017-10-31 NOTE — ED Notes (Signed)
Pt being transported to MRI prior to going to the floor.

## 2017-10-31 NOTE — ED Notes (Signed)
Spoke with Dr. Posey Pronto and informed him that MRI tech brought pt back to ED stating that they could not handle the patient in MRI. Per Dr. Posey Pronto pt can have MRI tomorrow.

## 2017-10-31 NOTE — Consult Note (Signed)
Reason for Consult: Hypoxemia Referring Physician: Dr. [name] is an 84 y.o. female.  HPI: Emily Moyer is a 84 year old female with a past medical history remarkable for hypertension, diabetes, neuropathy, CVA, breast carcinoma, atrial fibrillation on aspirin due to falls, receently hospitalized here recently with a UTI and pneumonia who left AGAINST MEDICAL ADVICE recently.  She is brought in by her daughter because she was found sitting on the side of her bed with right-sided gaze deviation, slurred speech and left-sided neglect by her daughter.    Was being admitted for stroke evaluation, CT scan of the head was negative, carotid ultrasound was performed along with MRI attempted however patient was given Haldol 2 mg secondary to reslessness and subsequently desaturated requiring increasing supplemental oxygen, MRI was canceled and she was brought back to the emergency department.  She is being admitted to the intensive care unit for close observationupon arrival into the intensive care unit she is awake, communicative, rightward gaze and left-sided neglect noted along with left facial droop, stable hemodynamics and oxygen saturation   Past Medical History:  Diagnosis Date  . Arthritis   . B12 deficiency 03/18/2014  . Benign essential HTN 03/18/2014  . Benign neoplasm of skin of upper limb, including shoulder   . Breast cancer ([place]) 12/02/02   lumpectomy/rad-positive  . 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 Colon polyp   . Hypertension   . Malignant neoplasm of lower-inner quadrant of female breast ([place])    11/03/2002: T1 C., N0 invasive mammary carcinoma, ER-positive, HER-2/neu not overexpressing. Treated with wide excision and sentinel node biopsy.  . Malignant neoplasm of upper limb ([facility])    left thumb, T1, N0 treated by partial thumb amputation, sentinel node biopsy.  Marland Kitchen Neuropathy    legs  . Peripheral nerve disease 03/18/2014  . SCC (squamous cell carcinoma), arm  05/05/2013   Overview:  Overview:  Left   . Skin cancer   . Stroke [facility])     Past Surgical History:  Procedure Laterality Date  . ABDOMINAL HYSTERECTOMY    . biopsy thumb Left 2013    0.4 x 1.8 x 1.8 cm histologic grade 2 invasive squamous cell. 7 mm from in situ carcinoma to resection, 1.2 cm from invasive cancer. Negative margins.pT1,N0  . BREAST BIOPSY Right 10/23/02   positive  . BREAST LUMPECTOMY Right 2004  . CHOLECYSTECTOMY    . COLONOSCOPY  2008  . COLONOSCOPY W/ POLYPECTOMY  06/23/2006 transverse colon tubular adenoma without high-grade dysplasia.  Marland Kitchen THUMB AMPUTATION Left 04/19/2012   Squamous cell carcinoma    Family History  Problem Relation Age of Onset  . Healthy Mother   . Healthy Father   . Dementia Sister   . Breast cancer Neg Hx     Social History:  reports that  has never smoked. Her smokeless tobacco use includes snuff. She reports that she does not drink alcohol or use drugs.  Allergies:  Allergies  Allergen Reactions  . Aspirin-Dipyridamole Er     Other reaction(s): Unknown  . Dipyridamole     Other reaction(s): Unknown  . Lyrica [Pregabalin] Swelling  . Clonidine Other (See Comments)    Dry mouth  . Cymbalta  [Duloxetine Hcl]     Other reaction(s): Other (See Comments) insomnia  . Duloxetine     Other reaction(s): Other (See Comments) insomnia    Medications: I have reviewed the patient's current medications.  Results for orders placed or performed during the hospital encounter of 10/31/17 (from the  past 48 hour(s))  Comprehensive metabolic panel     Status: Abnormal   Collection Time: 10/31/17 10:36 AM  Result Value Ref Range   Sodium 136 135 - 145 mmol/L   Potassium 3.7 3.5 - 5.1 mmol/L   Chloride 99 (L) 101 - 111 mmol/L   CO2 23 22 - 32 mmol/L   Glucose, Bld 147 (H) 65 - 99 mg/dL   BUN 29 (H) 6 - 20 mg/dL   Creatinine, Ser 1.53 (H) 0.44 - 1.00 mg/dL   Calcium 9.6 8.9 - 10.3 mg/dL   Total Protein 6.9 6.5 - 8.1 g/dL   Albumin 3.5 3.5  - 5.0 g/dL   AST 46 (H) 15 - 41 U/L   ALT 49 14 - 54 U/L   Alkaline Phosphatase 135 (H) 38 - 126 U/L   Total Bilirubin 1.0 0.3 - 1.2 mg/dL   GFR calc non Af Amer 29 (L) >60 mL/min   GFR calc Af Amer 33 (L) >60 mL/min    Comment: (NOTE) The eGFR has been calculated using the CKD EPI equation. This calculation has not been validated in all clinical situations. eGFR's persistently <60 mL/min signify possible Chronic Kidney Disease.    Anion gap 14 5 - 15    Comment: Performed at [facility], [address] [place], [place] [address]  CBC     Status: Abnormal   Collection Time: 10/31/17 10:36 AM  Result Value Ref Range   WBC 13.0 (H) 3.6 - 11.0 K/uL   RBC 4.41 3.80 - 5.20 MIL/uL   Hemoglobin 12.3 12.0 - 16.0 g/dL   HCT 37.9 35.0 - 47.0 %   MCV 86.0 80.0 - 100.0 fL   MCH 27.9 26.0 - 34.0 pg   MCHC 32.5 32.0 - 36.0 g/dL   RDW 16.3 (H) 11.5 - 14.5 %   Platelets 328 150 - 440 K/uL    Comment: Performed at [facility], [address] [place], [place] [address]  Protime-INR     Status: None   Collection Time: 10/31/17 10:36 AM  Result Value Ref Range   Prothrombin Time 13.2 11.4 - 15.2 seconds   INR 1.01     Comment: Performed at [facility], [address] [place], [place] [address]  APTT     Status: None   Collection Time: 10/31/17 10:36 AM  Result Value Ref Range   aPTT 29 24 - 36 seconds    Comment: Performed at [facility], [address] [place], [place] [address]  Troponin I     Status: None   Collection Time: 10/31/17 10:36 AM  Result Value Ref Range   Troponin I <0.03 <0.03 ng/mL    Comment: Performed at [facility], [address] [place], [place] [address]  Urine Drug Screen, Qualitative     Status: None   Collection Time: 10/31/17 10:36 AM  Result Value Ref Range   Tricyclic, Ur Screen NONE DETECTED NONE DETECTED   Amphetamines, Ur Screen NONE DETECTED NONE DETECTED   MDMA (Ecstasy)Ur Screen NONE DETECTED  NONE DETECTED   Cocaine Metabolite,Ur [place] NONE DETECTED NONE DETECTED   Opiate, Ur Screen NONE DETECTED NONE DETECTED   Phencyclidine (PCP) Ur S NONE DETECTED NONE DETECTED   Cannabinoid 50 Ng, Ur [place] NONE DETECTED NONE DETECTED   Barbiturates, Ur Screen NONE DETECTED NONE DETECTED   Benzodiazepine, Ur Scrn NONE DETECTED NONE DETECTED   Methadone Scn, Ur NONE DETECTED NONE DETECTED    Comment: (NOTE) Tricyclics +  metabolites, urine    Cutoff 1000 ng/mL Amphetamines + metabolites, urine  Cutoff 1000 ng/mL MDMA (Ecstasy), urine              Cutoff 500 ng/mL Cocaine Metabolite, urine          Cutoff 300 ng/mL Opiate + metabolites, urine        Cutoff 300 ng/mL Phencyclidine (PCP), urine         Cutoff 25 ng/mL Cannabinoid, urine                 Cutoff 50 ng/mL Barbiturates + metabolites, urine  Cutoff 200 ng/mL Benzodiazepine, urine              Cutoff 200 ng/mL Methadone, urine                   Cutoff 300 ng/mL The urine drug screen provides only a preliminary, unconfirmed analytical test result and should not be used for non-medical purposes. Clinical consideration and professional judgment should be applied to any positive drug screen result due to possible interfering substances. A more specific alternate chemical method must be used in order to obtain a confirmed analytical result. Gas chromatography / mass spectrometry (GC/MS) is the preferred confirmat ory method. Performed at [facility], [address] [place], [place] [address]   Urinalysis, Routine w reflex microscopic     Status: Abnormal   Collection Time: 10/31/17 10:36 AM  Result Value Ref Range   Color, Urine YELLOW (A) YELLOW   APPearance CLEAR (A) CLEAR   Specific Gravity, Urine 1.015 1.005 - 1.030   pH 5.0 5.0 - 8.0   Glucose, UA NEGATIVE NEGATIVE mg/dL   Hgb urine dipstick NEGATIVE NEGATIVE   Bilirubin Urine NEGATIVE NEGATIVE   Ketones, ur NEGATIVE NEGATIVE mg/dL   Protein, ur NEGATIVE NEGATIVE  mg/dL   Nitrite NEGATIVE NEGATIVE   Leukocytes, UA NEGATIVE NEGATIVE    Comment: Performed at [address] [place], [place] [address]    Ct Head Wo Contrast  Result Date: 10/31/2017 CLINICAL DATA:  Altered mental status. EXAM: CT HEAD WITHOUT CONTRAST TECHNIQUE: Contiguous axial images were obtained from the base of the skull through the vertex without intravenous contrast. COMPARISON:  10/20/2017 FINDINGS: Brain: Old right parietal infarct again noted, unchanged. There is atrophy and chronic small vessel disease changes. No acute intracranial abnormality. Specifically, no hemorrhage, hydrocephalus, mass lesion, acute infarction, or significant intracranial injury. Vascular: No hyperdense vessel or unexpected calcification. Skull: No acute calvarial abnormality. Sinuses/Orbits: Visualized paranasal sinuses and mastoids clear. Orbital soft tissues unremarkable. Other: None IMPRESSION: No acute intracranial abnormality. Atrophy, chronic microvascular disease. Old right parietal infarct. Electronically Signed   By: Rolm Baptise M.D.   On: 10/31/2017 11:03   US Carotid Bilateral (at [facility] Only)  Result Date: 10/31/2017 CLINICAL DATA:  84 year old female with symptoms of cerebrovascular accident EXAM: BILATERAL CAROTID DUPLEX ULTRASOUND TECHNIQUE: Pearline Cables scale imaging, color Doppler and duplex ultrasound were performed of bilateral carotid and vertebral arteries in the neck. COMPARISON:  None. FINDINGS: Criteria: Quantification of carotid stenosis is based on velocity parameters that correlate the residual internal carotid diameter with NASCET-based stenosis levels, using the diameter of the distal internal carotid lumen as the denominator for stenosis measurement. The following velocity measurements were obtained: RIGHT ICA:  69/13 cm/sec CCA:  29/5 cm/sec SYSTOLIC ICA/CCA RATIO:  1.2 DIASTOLIC ICA/CCA RATIO:  1.5 ECA:  201 cm/sec LEFT ICA:  146/30 cm/sec CCA:  68/54 cm/sec  SYSTOLIC ICA/CCA RATIO:  2.2 DIASTOLIC ICA/CCA RATIO:  5.5 ECA:  73 cm/sec RIGHT CAROTID ARTERY: Heterogeneous atherosclerotic plaque in the distal common carotid artery and proximal internal carotid artery. By peak systolic velocity criteria, the estimated stenosis remains less than 50%. RIGHT VERTEBRAL ARTERY:  Patent with normal antegrade flow. LEFT CAROTID ARTERY: Heterogeneous and irregular atherosclerotic plaque throughout the left common carotid artery extending into the proximal internal carotid artery. By peak systolic velocity criteria, the estimated stenosis falls in the 50-69% range. LEFT VERTEBRAL ARTERY:  Patent with normal antegrade flow. IMPRESSION: 1. Moderate (50-69%) stenosis proximal left internal carotid artery secondary to heterogeneous and irregular atherosclerotic plaque. 2. Mild (1-49%) stenosis proximal right internal carotid artery secondary to heterogenous atherosclerotic plaque. 3. Vertebral arteries are patent with antegrade flow. Signed, Criselda Peaches, MD [facility] Electronically Signed   By: Jacqulynn Cadet M.D.   On: 10/31/2017 14:57   Dg Chest Port 1 View  Result Date: 10/31/2017 CLINICAL DATA:  Atrial fibrillation EXAM: PORTABLE CHEST 1 VIEW COMPARISON:  10/20/2017 FINDINGS: Moderate bilateral pleural effusions, increasing since prior study. Cardiomegaly with bilateral airspace opacities compatible with edema/CHF, worsening. IMPRESSION: Worsening CHF and bilateral effusions. Electronically Signed   By: Rolm Baptise M.D.   On: 10/31/2017 16:51    ROS review of systems unable to be obtained at this time Blood pressure 129/90, pulse (!) 51, resp. rate (!) 21, SpO2 92 %. Physical Exam Patient is awake, will answer questions, she is an elderly female HEENT: .  Patient has a rightward gaze, trachea is midline, no thyromegaly noted, no accessory muscle utilization appreciated, no jugular venous distention,  carotid bruits appreciated Cardiovascular: Irregularly irregular rhythm with rapid ventricular response Pulmonary: Rhonchi appreciated bilateral right greater than left Abdominal: Positive bowel sounds, soft exam Extremities: No clubbing cyanosis or edema noted Neurologic: Patient has right-sided gaze, left-sided hemineglect, facial asymmetry with left-sided droop.  She does however move all extremities Cutaneous: No rashes or lesions appreciated   Assessment/Plan:  CVA.  Patient admitted to the intensive care unit secondary to desaturation while trying to obtain MRI.  Presently she is on 6 L nasal cannula and is saturating well.  No accessory muscle utilization and no respiratory distress noted.  She has an MRI, neurology consult, echocardiogram pending.  Etiology most likely embolic secondary to atrial fibrillation.  Patient has only been on aspirin secondary to multiple falls and poor anticoagulation candidate.    Congestive heart failure.  Chest x-ray shows bilateral effusions, cephalization of flow, prominent vascular markings consistent with CHF.  Will diuresis as blood pressure allows.  EKG does not reveal any acute ischemic changes.  Rapid atrial fibrillation noted.  Will trend cardiac enzymes  Rapid atrial fibrillation.  Patient starting on a Cardizem infusion for rate control  Hypertension.  Leukocytosis  Renal insufficiency  Hermelinda Dellen, DO .  Emily Moyer 10/31/2017, 6:18 PM

## 2017-10-31 NOTE — ED Provider Notes (Signed)
[facility] Emergency Department Provider Note  ____________________________________________  Time seen: Approximately 12:09 PM  I have reviewed the triage vital signs and the nursing notes.   HISTORY  Chief Complaint Altered Mental Status  Level 5 caveat:  Portions of the history and physical were unable to be obtained due to AMS and slurred speech   HPI Emily Moyer is a 84 y.o. female with a history of atrial fibrillation on aspirin only, hypertension, CVA with no prior deficits, breast cancer who presents for evaluation of altered mental status. Patient went seen normal yesterday evening. This morning at 8 AM she was found sitting on the side of her bed with a right-sided gaze deviation, slurred speech, and a left-sided neglect by her sister. Patient denies headache, chest pain, fever, URI symptoms, or trauma. Patient's speech is very slurred which makes understanding her very difficult. She is alert and oriented 2.According to sister patient is non compliant with all her meds other than ASA.  Past Medical History:  Diagnosis Date  . Arthritis   . B12 deficiency 03/18/2014  . Benign essential HTN 03/18/2014  . Benign neoplasm of skin of upper limb, including shoulder   . Breast cancer ([place]) 12/02/02   lumpectomy/rad-positive  . 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 Colon polyp   . Hypertension   . Malignant neoplasm of lower-inner quadrant of female breast ([place])    11/03/2002: T1 C., N0 invasive mammary carcinoma, ER-positive, HER-2/neu not overexpressing. Treated with wide excision and sentinel node biopsy.  . Malignant neoplasm of upper limb (HCC)    left thumb, T1, N0 treated by partial thumb amputation, sentinel node biopsy.  Marland Kitchen Neuropathy    legs  . Peripheral nerve disease 03/18/2014  . SCC (squamous cell carcinoma), arm 05/05/2013   Overview:  Overview:  Left   . Skin cancer   . Stroke [facility])     Patient Active Problem List   Diagnosis  Date Noted  . Atrial fibrillation ([place]) 10/20/2017  . Nodule of chest wall 06/24/2016  . 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 Other long term (current) drug therapy 03/29/2015  . History of breast cancer 06/20/2014  . Chronic anemia 03/18/2014  . B12 deficiency 03/18/2014  . Chronic kidney disease (CKD), stage III (moderate) ([place]) 03/18/2014  . Benign essential HTN 03/18/2014  . Chemical diabetes ([place]) 03/18/2014  . Combined fat and carbohydrate induced hyperlipemia 03/18/2014  . Peripheral nerve disease ([place]) 03/18/2014  . Abnormal presence of protein in urine 03/18/2014  . Avitaminosis D 03/18/2014  . Breast cancer of lower-inner quadrant of right female breast ([place]) 05/05/2013  . Squamous cell cancer of skin of thumb 05/05/2013  . Malignant neoplasm of lower-inner quadrant of female breast ([place]) 05/05/2013  . SCC (squamous cell carcinoma), arm 05/05/2013    Past Surgical History:  Procedure Laterality Date  . ABDOMINAL HYSTERECTOMY    . biopsy thumb Left 2013    0.4 x 1.8 x 1.8 cm histologic grade 2 invasive squamous cell. 7 mm from in situ carcinoma to resection, 1.2 cm from invasive cancer. Negative margins.pT1,N0  . BREAST BIOPSY Right 10/23/02   positive  . BREAST LUMPECTOMY Right 2004  . CHOLECYSTECTOMY    . COLONOSCOPY  2008  . COLONOSCOPY W/ POLYPECTOMY  06/23/2006 transverse colon tubular adenoma without high-grade dysplasia.  Marland Kitchen THUMB AMPUTATION Left 04/19/2012   Squamous cell carcinoma    Prior to Admission medications   Medication Sig Start Date End  Date Taking? Authorizing Provider  amLODipine (NORVASC) 10 MG tablet Take 10 mg by mouth daily.     [provider]  aspirin 81 MG tablet Take 81 mg by mouth daily.    [provider]  atorvastatin (LIPITOR) 40 MG tablet Take 1 tablet by mouth daily. 04/16/13   [provider]  Cholecalciferol (VITAMIN D-3) 1000 UNITS CAPS Take 1 capsule by mouth daily.     [provider]  ciprofloxacin (CIPRO) 250 MG tablet Take 1 tablet (250 mg total) by mouth every 12 (twelve) hours. Patient not taking: Reported on 10/20/2017 10/02/17   Coral Spikes, DO  lisinopril-hydrochlorothiazide (PRINZIDE,ZESTORETIC) 20-12.5 MG per tablet Take 2 tablets by mouth daily.  04/16/13   [provider]  metoprolol succinate (TOPROL-XL) 100 MG 24 hr tablet Take 1 tablet by mouth daily. 04/30/13   [provider]  Omega-3 Fatty Acids (FISH OIL) 1200 MG CAPS Take 1 capsule by mouth daily.    [provider]  pregabalin (LYRICA) 50 MG capsule Take 50 mg by mouth 2 (two) times daily.    [provider]  traMADol (ULTRAM) 50 MG tablet Take 50 mg by mouth every 6 (six) hours as needed.  06/17/17   [provider]  traZODone (DESYREL) 50 MG tablet Take 50 mg by mouth at bedtime.    [provider]  vitamin B-12 (CYANOCOBALAMIN) 500 MCG tablet Take 500 mcg by mouth daily.    [provider]    Allergies Aspirin-dipyridamole er; Dipyridamole; Lyrica [pregabalin]; Clonidine; Cymbalta  [duloxetine hcl]; and Duloxetine  Family History  Problem Relation Age of Onset  . Healthy Mother   . Healthy Father   . Dementia Sister   . Breast cancer Neg Hx     Social History Social History   Tobacco Use  . Smoking status: Never Smoker  . Smokeless tobacco: Current User    Types: Snuff  . Tobacco comment: used 35 years  Substance Use Topics  . Alcohol use: No  . Drug use: No    Review of Systems  Constitutional: Negative for fever. Eyes: Negative for visual changes. ENT: Negative for sore throat. Neck: No neck pain  Cardiovascular: Negative for chest pain. Respiratory: Negative for shortness of breath. Gastrointestinal: Negative for abdominal pain, vomiting or diarrhea. Genitourinary: Negative for dysuria. Musculoskeletal: Negative for back pain. Skin: Negative for rash. Neurological: + R gaze deviation and L  sided neglect, slurred speech Psych: No SI or HI  ____________________________________________   PHYSICAL EXAM:  VITAL SIGNS: ED Triage Vitals [10/31/17 1030]  Enc Vitals Group     BP 128/67     Pulse Rate 98     Resp 18     Temp      Temp src      SpO2 96 %     Weight      Height      Head Circumference      Peak Flow      Pain Score      Pain Loc      Pain Edu?      Excl. in [place]?     Constitutional: Alert and oriented x2. Patient is confused, removing clothes, grabbing on equipment. HEENT:      Head: Normocephalic and atraumatic.         Eyes: Conjunctivae are normal. Sclera is non-icteric.       Mouth/Throat: Mucous membranes are moist.       Neck: Supple with no signs  of meningismus. Cardiovascular: Regular rate and rhythm. No murmurs, gallops, or rubs. 2+ symmetrical distal pulses are present in all extremities. No JVD. Respiratory: Normal respiratory effort. Lungs are clear to auscultation bilaterally. No wheezes, crackles, or rhonchi.  Gastrointestinal: Soft, non tender, and non distended with positive bowel sounds. No rebound or guarding. Musculoskeletal: Nontender with normal range of motion in all extremities. No edema, cyanosis, or erythema of extremities. Neurologic: R gaze deviation, L sided facial droop, slurred speech, L sided neglect, moving all 4 extremities voluntarily, follows commands on the R but does not follow commands on the L Skin: Skin is warm, dry and intact. No rash noted.   ____________________________________________   LABS (all labs ordered are listed, but only abnormal results are displayed)  Labs Reviewed  COMPREHENSIVE METABOLIC PANEL - Abnormal; Notable for the following components:      Result Value   Chloride 99 (*)    Glucose, Bld 147 (*)    BUN 29 (*)    Creatinine, Ser 1.53 (*)    AST 46 (*)    Alkaline Phosphatase 135 (*)    GFR calc non Af Amer 29 (*)    GFR calc Af Amer 33 (*)    All other components within normal  limits  CBC - Abnormal; Notable for the following components:   WBC 13.0 (*)    RDW 16.3 (*)    All other components within normal limits  URINALYSIS, ROUTINE W REFLEX MICROSCOPIC - Abnormal; Notable for the following components:   Color, Urine YELLOW (*)    APPearance CLEAR (*)    All other components within normal limits  PROTIME-INR  APTT  TROPONIN I  URINE DRUG SCREEN, QUALITATIVE ([facility] ONLY)  TROPONIN I  APTT  PROTIME-INR  CBG MONITORING, ED   ____________________________________________  EKG  ED ECG REPORT I, Rudene Re, the attending physician, personally viewed and interpreted this ECG.  Atrial fibrillation, rate of 118, normal QTC, normal axis, no ST elevations or depressions. Unchanged from prior.  ____________________________________________  RADIOLOGY  Interpreted by me: CT head: remote R parietal stroke, no acute findings.    Interpretation by Radiologist:  Ct Head Wo Contrast  Result Date: 10/31/2017 CLINICAL DATA:  Altered mental status. EXAM: CT HEAD WITHOUT CONTRAST TECHNIQUE: Contiguous axial images were obtained from the base of the skull through the vertex without intravenous contrast. COMPARISON:  10/20/2017 FINDINGS: Brain: Old right parietal infarct again noted, unchanged. There is atrophy and chronic small vessel disease changes. No acute intracranial abnormality. Specifically, no hemorrhage, hydrocephalus, mass lesion, acute infarction, or significant intracranial injury. Vascular: No hyperdense vessel or unexpected calcification. Skull: No acute calvarial abnormality. Sinuses/Orbits: Visualized paranasal sinuses and mastoids clear. Orbital soft tissues unremarkable. Other: None IMPRESSION: No acute intracranial abnormality. Atrophy, chronic microvascular disease. Old right parietal infarct. Electronically Signed   By: Rolm Baptise M.D.   On: 10/31/2017 11:03     ____________________________________________   PROCEDURES  Procedure(s)  performed: None Procedures Critical Care performed: yes  CRITICAL CARE Performed by: Rudene Re  ?  Total critical care time: 40 min  Critical care time was exclusive of separately billable procedures and treating other patients.  Critical care was necessary to treat or prevent imminent or life-threatening deterioration.  Critical care was time spent personally by me on the following activities: development of treatment plan with patient and/or surrogate as well as nursing, discussions with consultants, evaluation of patient's response to treatment, examination of patient, obtaining history from patient or surrogate, ordering and  performing treatments and interventions, ordering and review of laboratory studies, ordering and review of radiographic studies, pulse oximetry and re-evaluation of patient's condition.  ____________________________________________   INITIAL IMPRESSION / ASSESSMENT AND PLAN / ED COURSE  84 y.o. female with a history of atrial fibrillation on aspirin only, hypertension, CVA with no prior deficits, breast cancer who presents for evaluation of altered mental status. Patient's clinical presentation consistent with acute stroke outside tPA window. Head CT negative for hemorrhagic stroke. Patient given rectal ASA. Patient also with afib with RVR. BP in the 130s. At this time I do not want to give patient BB or CCB for rate control to avoid dropping patient's BP. Discussed with Dr. Ubaldo Glassing who recommended giving digoxin 0.182m x1 and if rate not controlled well to redose it. Will give magnesium IV to assist with rate control as well. Labs showing slightly worse creatinine with GFR 29 (mid 30s baseline). Leukocytosis probably stress response due to stroke. Patient will be admitted to the Hospitalist      As part of my medical decision making, I reviewed the following data within the [place] obtained from family, Nursing notes reviewed and  incorporated, Labs reviewed , EKG interpreted , Old EKG reviewed, Radiograph reviewed , Discussed with admitting physician , Notes from prior ED visits and [place] Controlled Substance Database    Pertinent labs & imaging results that were available during my care of the patient were reviewed by me and considered in my medical decision making (see chart for details).    ____________________________________________   FINAL CLINICAL IMPRESSION(S) / ED DIAGNOSES  Final diagnoses:  Cerebrovascular accident (CVA), unspecified mechanism ([place]  Atrial fibrillation with RVR ([place]      NEW MEDICATIONS STARTED DURING THIS VISIT:  ED Discharge Orders    None       Note:  This document was prepared using Dragon voice recognition software and may include unintentional dictation errors.    VAlfred Levins [place] MD 10/31/17 [phone]

## 2017-10-31 NOTE — ED Notes (Signed)
Pt has cardizem drip order, pt unable to go to 1C. Will call pt placement and request tele bed

## 2017-10-31 NOTE — ED Notes (Signed)
Pt transported to MRI by Waunita Schooner, EMTP

## 2017-10-31 NOTE — ED Notes (Signed)
Attempted to call report on patient, RN in another room with pt, will call back

## 2017-11-01 ENCOUNTER — Inpatient Hospital Stay
Admit: 2017-11-01 | Discharge: 2017-11-01 | Disposition: A | Discharge status: Home / Self Care | Payer: Medicare Other | Attending: Internal Medicine | Admitting: Internal Medicine

## 2017-11-01 LAB — LIPID PANEL
Cholesterol: 129 mg/dL (ref 0–200)
HDL: 39 mg/dL — ABNORMAL LOW (ref >40)
LDL Cholesterol: 75 mg/dL (ref 0–99)
Total CHOL/HDL Ratio: 3.3 RATIO
Triglycerides: 74 mg/dL (ref <150)
VLDL: 15 mg/dL (ref 0–40)

## 2017-11-01 LAB — PHOSPHORUS: PHOSPHORUS: 3.4 mg/dL (ref 2.5–4.6)

## 2017-11-01 LAB — CBC
HEMATOCRIT: 37.9 % (ref 35.0–47.0)
Hemoglobin: 12.4 g/dL (ref 12.0–16.0)
MCH: 28.1 pg (ref 26.0–34.0)
MCHC: 32.7 g/dL (ref 32.0–36.0)
MCV: 85.9 fL (ref 80.0–100.0)
PLATELETS: 333 10*3/uL (ref 150–440)
RBC: 4.41 MIL/uL (ref 3.80–5.20)
RDW: 16.3 % — AB (ref 11.5–14.5)
WBC: 15.3 10*3/uL — AB (ref 3.6–11.0)

## 2017-11-01 LAB — HEMOGLOBIN A1C
HEMOGLOBIN A1C: 6.2 % — AB (ref 4.8–5.6)
Mean Plasma Glucose: 131.24 mg/dL

## 2017-11-01 LAB — BASIC METABOLIC PANEL
Anion gap: 16 — ABNORMAL HIGH (ref 5–15)
BUN: 29 mg/dL — AB (ref 6–20)
CALCIUM: 10 mg/dL (ref 8.9–10.3)
CO2: 22 mmol/L (ref 22–32)
CREATININE: 1.32 mg/dL — AB (ref 0.44–1.00)
Chloride: 101 mmol/L (ref 101–111)
GFR calc Af Amer: 40 mL/min — ABNORMAL LOW (ref >60)
GFR, EST NON AFRICAN AMERICAN: 34 mL/min — AB (ref >60)
Glucose, Bld: 139 mg/dL — ABNORMAL HIGH (ref 65–99)
POTASSIUM: 3.8 mmol/L (ref 3.5–5.1)
SODIUM: 139 mmol/L (ref 135–145)

## 2017-11-01 LAB — MRSA PCR SCREENING: MRSA by PCR: NEGATIVE

## 2017-11-01 LAB — ECHOCARDIOGRAM COMPLETE

## 2017-11-01 LAB — MAGNESIUM: MAGNESIUM: 1.5 mg/dL — AB (ref 1.7–2.4)

## 2017-11-01 MED ORDER — ORAL CARE MOUTH RINSE
15.0000 mL | Freq: Two times a day (BID) | OROMUCOSAL | Status: DC
  Administered 2017-11-02 – 2017-11-08 (×12): 15 mL via OROMUCOSAL

## 2017-11-01 MED ORDER — ORAL CARE MOUTH RINSE
15.0000 mL | Freq: Two times a day (BID) | OROMUCOSAL | Status: DC
  Administered 2017-11-01: 15 mL via OROMUCOSAL

## 2017-11-01 MED ORDER — CHLORHEXIDINE GLUCONATE 0.12 % MT SOLN
15.0000 mL | Freq: Two times a day (BID) | OROMUCOSAL | Status: DC
  Administered 2017-11-01 – 2017-11-08 (×11): 15 mL via OROMUCOSAL
  Filled 2017-11-01 (×5): qty 15

## 2017-11-01 MED ORDER — MAGNESIUM SULFATE 4 GM/100ML IV SOLN
4.0000 g | Freq: Once | INTRAVENOUS | Status: AC
  Administered 2017-11-01: 4 g via INTRAVENOUS
  Filled 2017-11-01: qty 100

## 2017-11-01 NOTE — Consult Note (Signed)
Reason for Consult:stroke symptoms  Referring Physician: Dr. Posey Pronto  CC: aphasia and stroke like symptoms   HPI: Emily Moyer is an 84 y.o. female with a known history of chronic atrial fibrillation only on aspirin due to falls, essential hypertension previous CVA who was actually hospitalized here recently with a UTI and pneumonia who left [place] recently.  She is brought in by her daughter because she was found sitting on the side of her bed with right-sided gaze deviation, slurred speech and left-sided neglect by her daughter.  CTH was done did not show any acute abnormalities.       Past Medical History:  Diagnosis Date  . Arthritis   . B12 deficiency 03/18/2014  . Benign essential HTN 03/18/2014  . Benign neoplasm of skin of upper limb, including shoulder   . Breast cancer ([place]) 12/02/02   lumpectomy/rad-positive  . 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 Colon polyp   . Hypertension   . Malignant neoplasm of lower-inner quadrant of female breast ([place])    11/03/2002: T1 C., N0 invasive mammary carcinoma, ER-positive, HER-2/neu not overexpressing. Treated with wide excision and sentinel node biopsy.  . Malignant neoplasm of upper limb (HCC)    left thumb, T1, N0 treated by partial thumb amputation, sentinel node biopsy.  Marland Kitchen Neuropathy    legs  . Peripheral nerve disease 03/18/2014  . SCC (squamous cell carcinoma), arm 05/05/2013   Overview:  Overview:  Left   . Skin cancer   . Stroke [facility])     Past Surgical History:  Procedure Laterality Date  . ABDOMINAL HYSTERECTOMY    . biopsy thumb Left 2013    0.4 x 1.8 x 1.8 cm histologic grade 2 invasive squamous cell. 7 mm from in situ carcinoma to resection, 1.2 cm from invasive cancer. Negative margins.pT1,N0  . BREAST BIOPSY Right 10/23/02   positive  . BREAST LUMPECTOMY Right 2004  . CHOLECYSTECTOMY    . COLONOSCOPY  2008  . COLONOSCOPY W/ POLYPECTOMY  06/23/2006 transverse colon tubular adenoma without high-grade  dysplasia.  Marland Kitchen THUMB AMPUTATION Left 04/19/2012   Squamous cell carcinoma    Family History  Problem Relation Age of Onset  . Healthy Mother   . Healthy Father   . Dementia Sister   . Breast cancer Neg Hx     Social History:  reports that  has never smoked. Her smokeless tobacco use includes snuff. She reports that she does not drink alcohol or use drugs.  Allergies  Allergen Reactions  . Aspirin-Dipyridamole Er     Other reaction(s): Unknown  . Dipyridamole     Other reaction(s): Unknown  . Lyrica [Pregabalin] Swelling  . Clonidine Other (See Comments)    Dry mouth  . Cymbalta  [Duloxetine Hcl]     Other reaction(s): Other (See Comments) insomnia  . Duloxetine     Other reaction(s): Other (See Comments) insomnia    Medications: I have reviewed the patient's current medications.  ROS: Unable to obtain due to dysarthria and confusion   Physical Examination: Blood pressure 104/75, pulse (!) 107, temperature 99.3 F (37.4 C), temperature source Axillary, resp. rate (!) 27, SpO2 90 %.  Neurological Examination   Mental Status: Dysarthria, attempts to speak  Cranial Nerves: II: Discs flat bilaterally; Visual fields grossly normal, pupils equal, round, reactive to light and accommodation III,IV, VI: ptosis not present, extra-ocular motions intact bilaterally V,VII: ? L facial droop  VIII: hearing normal bilaterally IX,X: gag reflex present XI: bilateral shoulder shrug XII:  midline tongue extension Motor: Right : Upper extremity   4/5    Left:     Upper extremity   2/5  Lower extremity   4/5     Lower extremity   2/5 Tone and bulk:normal tone throughout; no atrophy noted Sensory: Unable to test Deep Tendon Reflexes: 1+ and symmetric throughout Cerebellar: Not tested  Gait: not tested      Laboratory Studies:   Basic Metabolic Panel: Recent Labs  Lab 10/31/17 1036 11/01/17 0524  NA 136 139  K 3.7 3.8  CL 99* 101  CO2 23 22  GLUCOSE 147* 139*  BUN 29*  29*  CREATININE 1.53* 1.32*  CALCIUM 9.6 10.0  MG  --  1.5*  PHOS  --  3.4    Liver Function Tests: Recent Labs  Lab 10/31/17 1036  AST 46*  ALT 49  ALKPHOS 135*  BILITOT 1.0  PROT 6.9  ALBUMIN 3.5   No results for input(s): LIPASE, AMYLASE in the last 168 hours. No results for input(s): AMMONIA in the last 168 hours.  CBC: Recent Labs  Lab 10/31/17 1036 11/01/17 0524  WBC 13.0* 15.3*  HGB 12.3 12.4  HCT 37.9 37.9  MCV 86.0 85.9  PLT 328 333    Cardiac Enzymes: Recent Labs  Lab 10/31/17 1036 10/31/17 2007  TROPONINI <0.03 <0.03    BNP: Invalid input(s): POCBNP  CBG: Recent Labs  Lab 10/31/17 1826  GLUCAP 135*    Microbiology: Results for orders placed or performed during the hospital encounter of 10/31/17  MRSA PCR Screening     Status: None   Collection Time: 11/01/17 12:05 AM  Result Value Ref Range Status   MRSA by PCR NEGATIVE NEGATIVE Final    Comment:        The GeneXpert MRSA Assay (FDA approved for NASAL specimens only), is one component of a comprehensive MRSA colonization surveillance program. It is not intended to diagnose MRSA infection nor to guide or monitor treatment for MRSA infections. Performed at [facility], [address] [place], [place] [address]     Coagulation Studies: Recent Labs    10/31/17 1036 10/31/17 2007  LABPROT 13.2 13.4  INR 1.01 1.03    Urinalysis:  Recent Labs  Lab 10/31/17 1036  COLORURINE YELLOW*  LABSPEC 1.015  PHURINE 5.0  GLUCOSEU NEGATIVE  HGBUR NEGATIVE  BILIRUBINUR NEGATIVE  KETONESUR NEGATIVE  PROTEINUR NEGATIVE  NITRITE NEGATIVE  LEUKOCYTESUR NEGATIVE    Lipid Panel:     Component Value Date/Time   CHOL 129 10/31/2017 2007   TRIG 74 10/31/2017 2007   HDL 39 (L) 10/31/2017 2007   CHOLHDL 3.3 10/31/2017 2007   VLDL 15 10/31/2017 2007   LDLCALC 75 10/31/2017 2007    HgbA1C:  Lab Results  Component Value Date   HGBA1C 6.2 (H) 10/31/2017    Urine Drug  Screen:      Component Value Date/Time   LABOPIA NONE DETECTED 10/31/2017 1036   COCAINSCRNUR NONE DETECTED 10/31/2017 1036   LABBENZ NONE DETECTED 10/31/2017 1036   AMPHETMU NONE DETECTED 10/31/2017 1036   THCU NONE DETECTED 10/31/2017 1036   LABBARB NONE DETECTED 10/31/2017 1036    Alcohol Level: No results for input(s): ETH in the last 168 hours.  Other results: A-fib  Imaging: Ct Head Wo Contrast  Result Date: 10/31/2017 CLINICAL DATA:  Altered mental status. EXAM: CT HEAD WITHOUT CONTRAST TECHNIQUE: Contiguous axial images were obtained from the base of the skull through the vertex without intravenous contrast.  COMPARISON:  10/20/2017 FINDINGS: Brain: Old right parietal infarct again noted, unchanged. There is atrophy and chronic small vessel disease changes. No acute intracranial abnormality. Specifically, no hemorrhage, hydrocephalus, mass lesion, acute infarction, or significant intracranial injury. Vascular: No hyperdense vessel or unexpected calcification. Skull: No acute calvarial abnormality. Sinuses/Orbits: Visualized paranasal sinuses and mastoids clear. Orbital soft tissues unremarkable. Other: None IMPRESSION: No acute intracranial abnormality. Atrophy, chronic microvascular disease. Old right parietal infarct. Electronically Signed   By: Rolm Baptise M.D.   On: 10/31/2017 11:03   US Carotid Bilateral (at [facility] Only)  Result Date: 10/31/2017 CLINICAL DATA:  84 year old female with symptoms of cerebrovascular accident EXAM: BILATERAL CAROTID DUPLEX ULTRASOUND TECHNIQUE: Pearline Cables scale imaging, color Doppler and duplex ultrasound were performed of bilateral carotid and vertebral arteries in the neck. COMPARISON:  None. FINDINGS: Criteria: Quantification of carotid stenosis is based on velocity parameters that correlate the residual internal carotid diameter with NASCET-based stenosis levels, using the diameter of the distal internal carotid lumen as the denominator for stenosis  measurement. The following velocity measurements were obtained: RIGHT ICA:  69/13 cm/sec CCA:  26/9 cm/sec SYSTOLIC ICA/CCA RATIO:  1.2 DIASTOLIC ICA/CCA RATIO:  1.5 ECA:  201 cm/sec LEFT ICA:  146/30 cm/sec CCA:  48/54 cm/sec SYSTOLIC ICA/CCA RATIO:  2.2 DIASTOLIC ICA/CCA RATIO:  5.5 ECA:  73 cm/sec RIGHT CAROTID ARTERY: Heterogeneous atherosclerotic plaque in the distal common carotid artery and proximal internal carotid artery. By peak systolic velocity criteria, the estimated stenosis remains less than 50%. RIGHT VERTEBRAL ARTERY:  Patent with normal antegrade flow. LEFT CAROTID ARTERY: Heterogeneous and irregular atherosclerotic plaque throughout the left common carotid artery extending into the proximal internal carotid artery. By peak systolic velocity criteria, the estimated stenosis falls in the 50-69% range. LEFT VERTEBRAL ARTERY:  Patent with normal antegrade flow. IMPRESSION: 1. Moderate (50-69%) stenosis proximal left internal carotid artery secondary to heterogeneous and irregular atherosclerotic plaque. 2. Mild (1-49%) stenosis proximal right internal carotid artery secondary to heterogenous atherosclerotic plaque. 3. Vertebral arteries are patent with antegrade flow. Signed, Criselda Peaches, MD [facility] Radiology Electronically Signed   By: Jacqulynn Cadet M.D.   On: 10/31/2017 14:57   Dg Chest Port 1 View  Result Date: 10/31/2017 CLINICAL DATA:  Atrial fibrillation EXAM: PORTABLE CHEST 1 VIEW COMPARISON:  10/20/2017 FINDINGS: Moderate bilateral pleural effusions, increasing since prior study. Cardiomegaly with bilateral airspace opacities compatible with edema/CHF, worsening. IMPRESSION: Worsening CHF and bilateral effusions. Electronically Signed   By: Rolm Baptise M.D.   On: 10/31/2017 16:51     Assessment/Plan:  84 y.o. female with a known history of chronic atrial fibrillation only on aspirin due to falls, essential hypertension  previous CVA who was actually hospitalized here recently with a UTI and pneumonia who left AGAINST MEDICAL ADVICE recently.  She is brought in by her daughter because she was found sitting on the side of her bed with right-sided gaze deviation, slurred speech and left-sided neglect by her daughter.  Patient does have a slurred speech she wants to go home.  According to her daughter her mother is noncompliant with all her medications other than aspirin.  - CTH done no acute abnormalities - Risk factor: A-fib, HTN - Not on anticoagulation due to falls. Was on ASA. Not compliant with BP medications - Pt is DNR - Not able to do MRI last night due to agitation and movement - potential attempt today - if not then CT and CTA and will  need some hydration as cret. Slightly elevated above baseline - guarded prognosis  Emily Moyer  11/01/2017, 11:25 AM

## 2017-11-01 NOTE — Progress Notes (Signed)
Follow up - Critical Care Medicine Note  Patient Details:    Emily Moyer is an 84 y.o. female.  Emily Moyer is a 84 year old female with a past medical history remarkable for hypertension, diabetes, neuropathy, CVA, breast carcinoma, atrial fibrillation on aspirin due to falls,receently hospitalized here recently with a UTI and pneumonia who left AGAINST MEDICAL ADVICE recently. She is brought in by her daughter because she was found sitting on the side of her bed with right-sided gaze deviation, slurred speech and left-sided neglect by her daughter.     Lines, Airways, Drains: Urethral Catheter Nena Alexander, RN Non-latex 14 Fr. (Active)  Indication for Insertion or Continuance of Catheter Aggressive IV diuresis 11/01/2017  8:00 AM  Site Assessment Clean;Intact 11/01/2017  2:00 AM  Catheter Maintenance Bag below level of bladder;Catheter secured;Drainage bag/tubing not touching floor;Insertion date on drainage bag;No dependent loops;Seal intact 11/01/2017  8:00 AM  Collection Container Standard drainage bag 11/01/2017  2:00 AM  Securement Method Securing device (Describe) 11/01/2017  2:00 AM  Urinary Catheter Interventions Unclamped 11/01/2017  2:00 AM  Output (mL) 85 mL 11/01/2017  4:47 AM    Anti-infectives:  Anti-infectives (From admission, onward)   None      Microbiology: Results for orders placed or performed during the hospital encounter of 10/31/17  MRSA PCR Screening     Status: None   Collection Time: 11/01/17 12:05 AM  Result Value Ref Range Status   MRSA by PCR NEGATIVE NEGATIVE Final    Comment:        The GeneXpert MRSA Assay (FDA approved for NASAL specimens only), is one component of a comprehensive MRSA colonization surveillance program. It is not intended to diagnose MRSA infection nor to guide or monitor treatment for MRSA infections. Performed at [facility], [address] [place], [place] [address]     Studies: Dg Chest 1  View  Result Date: 10/20/2017 CLINICAL DATA:  84 year old female with nausea and vomiting. Breast cancer post lumpectomy. Hypertension. Nonsmoker. Initial encounter. EXAM: CHEST 1 VIEW COMPARISON:  04/12/2012. FINDINGS: Pulmonary edema bilateral pleural effusions greater on right. Basal atelectasis suspected. Heart size top-normal. Calcified aorta. No acute osseous abnormality. IMPRESSION: Pulmonary edema with bilateral pleural effusions greater on right. Basilar subsegmental atelectasis. Aortic Atherosclerosis (ICD10-I70.0). Electronically Signed   By: Genia Del M.D.   On: 10/20/2017 07:58   Ct Head Wo Contrast  Result Date: 10/31/2017 CLINICAL DATA:  Altered mental status. EXAM: CT HEAD WITHOUT CONTRAST TECHNIQUE: Contiguous axial images were obtained from the base of the skull through the vertex without intravenous contrast. COMPARISON:  10/20/2017 FINDINGS: Brain: Old right parietal infarct again noted, unchanged. There is atrophy and chronic small vessel disease changes. No acute intracranial abnormality. Specifically, no hemorrhage, hydrocephalus, mass lesion, acute infarction, or significant intracranial injury. Vascular: No hyperdense vessel or unexpected calcification. Skull: No acute calvarial abnormality. Sinuses/Orbits: Visualized paranasal sinuses and mastoids clear. Orbital soft tissues unremarkable. Other: None IMPRESSION: No acute intracranial abnormality. Atrophy, chronic microvascular disease. Old right parietal infarct. Electronically Signed   By: Rolm Baptise M.D.   On: 10/31/2017 11:03   Ct Head Wo Contrast  Result Date: 10/20/2017 CLINICAL DATA:  84 year old female with sudden onset of nausea vomiting and weakness. Hypertension. Prior infarct. Initial encounter. EXAM: CT HEAD WITHOUT CONTRAST TECHNIQUE: Contiguous axial images were obtained from the base of the skull through the vertex without intravenous contrast. COMPARISON:  None. FINDINGS: Brain: No intracranial hemorrhage or  CT evidence of large acute infarct. Remote posterior  right frontal-parietal lobe infarct. Remote left lenticular nucleus/caudate infarct. Moderate chronic microvascular changes. Mild global atrophy. No intracranial mass lesion noted on this unenhanced exam. Prominent dural calcifications greater on left. Vascular: Vascular calcifications Skull: No skull fracture Sinuses/Orbits: Visualized orbital structures and paranasal sinuses unremarkable. Other: Mastoid air cells and middle ear cavities are clear. IMPRESSION: No acute intracranial abnormality. Remote posterior right frontal-parietal lobe infarct. Remote left lenticular nucleus/caudate infarct. Moderate chronic microvascular changes. Electronically Signed   By: Genia Del M.D.   On: 10/20/2017 08:47   Ct Chest Wo Contrast  Result Date: 10/20/2017 CLINICAL DATA:  Acute respiratory illness with pleural effusion. EXAM: CT CHEST WITHOUT CONTRAST TECHNIQUE: Multidetector CT imaging of the chest was performed following the standard protocol without IV contrast. COMPARISON:  Chest x-ray from earlier today FINDINGS: Cardiovascular: Biatrial enlargement. No pericardial effusion. Atherosclerotic calcification of the aorta and coronaries. Large main pulmonary artery as seen with pulmonary hypertension, 3.4 cm. Mediastinum/Nodes: Negative for adenopathy. There is a peripherally calcified nodule posteriorly from the left lobe thyroid measuring 2.2 cm. No associated adenopathy. Lungs/Pleura: Small left and more moderate right pleural effusions that are layering. Diffuse interlobular septal thickening and bronchial wall thickening. Atelectasis in the lower lobes with ground-glass density that could be alveolar edema or incomplete atelectasis. There is a focal ill-defined 2.1 cm dense opacity in the left upper lobe which is different than the other findings. Small subpleural pulmonary nodules bilaterally which will be seen at follow-up. Upper Abdomen: No acute finding.  Musculoskeletal: L1 superior endplate fracture with sclerosis and horizontal fracture plane beneath the mildly depressed superior endplate. This is likely not acute but does not appear healed. No retropulsion. IMPRESSION: 1. Moderate right and small left layering pleural effusions with pulmonary edema and lower lobe atelectasis. Biatrial enlargement. 2. 3 cm spiculated opacity in the superior segment left lower lobe which could be incidental neoplasm or pneumonia. Recommend follow-up noncontrast chest CT in 2 to 3 months. 3. L1 compression fracture that is likely subacute and unhealed. No retropulsion. Height loss is mild. 4. Aortic Atherosclerosis (ICD10-I70.0). Electronically Signed   By: Monte Fantasia M.D.   On: 10/20/2017 10:31   US Carotid Bilateral (at [facility] Only)  Result Date: 10/31/2017 CLINICAL DATA:  84 year old female with symptoms of cerebrovascular accident EXAM: BILATERAL CAROTID DUPLEX ULTRASOUND TECHNIQUE: Pearline Cables scale imaging, color Doppler and duplex ultrasound were performed of bilateral carotid and vertebral arteries in the neck. COMPARISON:  None. FINDINGS: Criteria: Quantification of carotid stenosis is based on velocity parameters that correlate the residual internal carotid diameter with NASCET-based stenosis levels, using the diameter of the distal internal carotid lumen as the denominator for stenosis measurement. The following velocity measurements were obtained: RIGHT ICA:  69/13 cm/sec CCA:  74/9 cm/sec SYSTOLIC ICA/CCA RATIO:  1.2 DIASTOLIC ICA/CCA RATIO:  1.5 ECA:  201 cm/sec LEFT ICA:  146/30 cm/sec CCA:  44/96 cm/sec SYSTOLIC ICA/CCA RATIO:  2.2 DIASTOLIC ICA/CCA RATIO:  5.5 ECA:  73 cm/sec RIGHT CAROTID ARTERY: Heterogeneous atherosclerotic plaque in the distal common carotid artery and proximal internal carotid artery. By peak systolic velocity criteria, the estimated stenosis remains less than 50%. RIGHT VERTEBRAL ARTERY:  Patent with normal antegrade flow. LEFT CAROTID  ARTERY: Heterogeneous and irregular atherosclerotic plaque throughout the left common carotid artery extending into the proximal internal carotid artery. By peak systolic velocity criteria, the estimated stenosis falls in the 50-69% range. LEFT VERTEBRAL ARTERY:  Patent with normal antegrade flow. IMPRESSION: 1. Moderate (50-69%) stenosis proximal left internal  carotid artery secondary to heterogeneous and irregular atherosclerotic plaque. 2. Mild (1-49%) stenosis proximal right internal carotid artery secondary to heterogenous atherosclerotic plaque. 3. Vertebral arteries are patent with antegrade flow. Signed, Criselda Peaches, MD [facility] Electronically Signed   By: Jacqulynn Cadet M.D.   On: 10/31/2017 14:57   Dg Chest Port 1 View  Result Date: 10/31/2017 CLINICAL DATA:  Atrial fibrillation EXAM: PORTABLE CHEST 1 VIEW COMPARISON:  10/20/2017 FINDINGS: Moderate bilateral pleural effusions, increasing since prior study. Cardiomegaly with bilateral airspace opacities compatible with edema/CHF, worsening. IMPRESSION: Worsening CHF and bilateral effusions. Electronically Signed   By: Rolm Baptise M.D.   On: 10/31/2017 16:51    Consults: Treatment Team:  Teodoro Spray, MD Clovis Fredrickson, MD   Subjective:    Overnight Issues: No overnight issues. Patient resting comfortably  Objective:  Vital signs for last 24 hours: Temp:  [97.5 F (36.4 C)-98.9 F (37.2 C)] 98.1 F (36.7 C) (02/10 0600) Pulse Rate:  [51-132] 115 (02/10 0600) Resp:  [19-43] 38 (02/10 0600) BP: (112-144)/(54-97) 113/82 (02/10 0600) SpO2:  [89 %-100 %] 92 % (02/10 0600)    Intake/Output from previous day: 02/09 0701 - 02/10 0700 In: 190.3 [I.V.:190.3] Out: 1350 [Urine:1350]  Intake/Output this shift: No intake/output data recorded.  Vent settings for last 24 hours:    Physical Exam:  Patient is awake, alert and responds, she has left-sided  neglect with rightward gaze but is awake and communicating. She does have afacial droop on the left. HEENT: No respiratory distress, trachea is midline, left-sided facial droop Cardiovascular: Patient with atrial fibrillation, irregularly irregular rhythm, ventricular responses in the low 100s Pulmonary: Scant basilar crackles appreciated right greater than left Abdominal: Positive bowel sounds, soft exam Extremities: No clubbing cyanosis or edema noted Neurologic: Patient with right-sided gaze, left-sided neglect with facial asymmetry does not remove all extremities  Assessment/Plan:   CVA.  Patient admitted to the intensive care unit secondary to desaturation while trying to obtain MRI.   patient is doing well this morning, was unable to get MRI secondary to desaturation. Pending neurology input complaining of MRI follow-up. Carotid ultrasound performed report noted.  Congestive heart failure. Patient diuresed 1.1 L. Presently on nasal cannula doing well  Rapid atrial fibrillation.  Patient starting on a Cardizem infusion for rate control  Hypertension. Blood pressure stable as morning  Leukocytosis. Leukocytosis of 15.3. No clear evidence of infection noted  Renal insufficiency. BUN/creatinine is 29/1.32 essentially the same. Will follow   Pending neurology input after consultation will consider for transfer   Emily Moyer 11/01/2017  *Care during the described time interval was provided by me and/or other providers on the critical care team.  I have reviewed this patient's available data, including medical history, events of note, physical examination and test results as part of my evaluation.

## 2017-11-01 NOTE — Plan of Care (Signed)
Pt admitted with CVA, unable to complete MRI on previous shift. Pt alert to self only. Pt remains NPO. NIH is 24 due to patient is not able to participate completely with neuro assessment. Pt does have purposeful movement of all extremities, slurred speech, right sided deviation noted and facial droop. Tele sitter initiated this shift for patient safety, hand mitts have also been placed on the patient for safety. Pt continues to be afib on the cardiac monitor and cardizem continues to infuse at 15 mcg/hr. Foley continues to drain clear yellow urine. MRSA swab sent this shift, results negative.

## 2017-11-01 NOTE — Consult Note (Signed)
Cardiology Consultation Note    Patient ID: Emily Moyer, MRN: [id number], DOB/AGE: [birth year] 82 y.o. Admit date: 10/31/2017   Date of Consult: 11/01/2017 Primary Physician: Ezequiel Kayser, MD Primary Cardiologist: Dr. Ubaldo Glassing  Chief Complaint: probable right sided cva Reason for Consultation: afib  Requesting MD: Dr. Jerelyn Charles  HPI: Emily Moyer is a 84 y.o. female with history of atrial fibrillation, hypertension, diabetes who was recently hospitalized with a UTI and pneumonia and left AMA.  She is now readmitted after being noted to have right-sided gaze deviation, left-sided neglect with slurred speech.  She underwent a brain CT which was negative.  Carotid Dopplers and MRI were not assessable due to agitation.  Patient was diagnosed with atrial fibrillation and chronic anticoagulation was deferred due to relative instability and fall risk.  He was treated with aspirin as well as amlodipine, lisinopril-hydrochlorothiazide, metoprolol succinate for blood pressure control and atorvastatin for hyperlipidemia.  Per family report, patient had stopped all of her medications except for aspirin.  EKG showed atrial fibrillation with variable ventricular response with an average rate of 118.  There is no appreciable change from electrocardiogram done as an outpatient as well as electrocardiogram done October 20, 2017 in the hospital.  Patient has no significant increase in her cardiac markers.  Right gaze preference and left-sided neglect.  His chronic kidney disease with a GFR of 34.  This appears to be near her baseline.  Electrolytes show potassium of 3.8.  CBC is elevated at 15.3.  X-ray showed pulmonary edema with bilateral effusions, carotid Doppler revealed moderate 50-69% stenosis of the proximal left internal carotid artery with a 1-49% stenosis in the proximal right carotid artery.  CT without contrast revealed no acute intracranial abnormality with atrophy and chronic microvascular disease  with an old parietal infarct.  Was compared to a brain CT done October 20, 2017.  As mentioned above brain MRI was not doable at least in the short-term secondary to patient agitation and movement.  Past Medical History:  Diagnosis Date  . Arthritis   . B12 deficiency 03/18/2014  . Benign essential HTN 03/18/2014  . Benign neoplasm of skin of upper limb, including shoulder   . Breast cancer ([place]) 12/02/02   lumpectomy/rad-positive  . 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 Colon polyp   . Hypertension   . Malignant neoplasm of lower-inner quadrant of female breast ([place])    11/03/2002: T1 C., N0 invasive mammary carcinoma, ER-positive, HER-2/neu not overexpressing. Treated with wide excision and sentinel node biopsy.  . Malignant neoplasm of upper limb ([facility])    left thumb, T1, N0 treated by partial thumb amputation, sentinel node biopsy.  Marland Kitchen Neuropathy    legs  . Peripheral nerve disease 03/18/2014  . SCC (squamous cell carcinoma), arm 05/05/2013   Overview:  Overview:  Left   . Skin cancer   . Stroke [facility])       Surgical History:  Past Surgical History:  Procedure Laterality Date  . ABDOMINAL HYSTERECTOMY    . biopsy thumb Left 2013    0.4 x 1.8 x 1.8 cm histologic grade 2 invasive squamous cell. 7 mm from in situ carcinoma to resection, 1.2 cm from invasive cancer. Negative margins.pT1,N0  . BREAST BIOPSY Right 10/23/02   positive  . BREAST LUMPECTOMY Right 2004  . CHOLECYSTECTOMY    . COLONOSCOPY  2008  . COLONOSCOPY W/ POLYPECTOMY  06/23/2006 transverse colon tubular adenoma without high-grade dysplasia.  Marland Kitchen THUMB AMPUTATION Left 04/19/2012   Squamous cell  carcinoma     Home Meds: Prior to Admission medications   Medication Sig Start Date End Date Taking? Authorizing Provider  amLODipine (NORVASC) 10 MG tablet Take 10 mg by mouth daily.    Yes [provider]  aspirin 81 MG tablet Take 81 mg by mouth daily.   Yes [provider]  atorvastatin (LIPITOR) 40 MG tablet  Take 1 tablet by mouth daily. 04/16/13  Yes [provider]  Cholecalciferol (VITAMIN D-3) 1000 UNITS CAPS Take 1 capsule by mouth daily.   Yes [provider]  lisinopril-hydrochlorothiazide (PRINZIDE,ZESTORETIC) 20-12.5 MG per tablet Take 2 tablets by mouth daily.  04/16/13  Yes [provider]  metoprolol succinate (TOPROL-XL) 100 MG 24 hr tablet Take 1 tablet by mouth daily. 04/30/13  Yes [provider]  Omega-3 Fatty Acids (FISH OIL) 1200 MG CAPS Take 1 capsule by mouth daily.   Yes [provider]  traMADol (ULTRAM) 50 MG tablet Take 50 mg by mouth every 6 (six) hours as needed.  06/17/17  Yes [provider]  vitamin B-12 (CYANOCOBALAMIN) 500 MCG tablet Take 500 mcg by mouth daily.   Yes [provider]  ciprofloxacin (CIPRO) 250 MG tablet Take 1 tablet (250 mg total) by mouth every 12 (twelve) hours. Patient not taking: Reported on 10/20/2017 10/02/17   Coral Spikes, DO    Inpatient Medications:  .  stroke: mapping our early stages of recovery book   Does not apply Once  . digoxin  0.13 mg Intravenous Once  . enoxaparin (LOVENOX) injection  30 mg Subcutaneous Q24H  . LORazepam  2 mg Intravenous Once  . mouth rinse  15 mL Mouth Rinse BID   . diltiazem (CARDIZEM) infusion 15 mg/hr (11/01/17 3007)    Allergies:  Allergies  Allergen Reactions  . Aspirin-Dipyridamole Er     Other reaction(s): Unknown  . Dipyridamole     Other reaction(s): Unknown  . Lyrica [Pregabalin] Swelling  . Clonidine Other (See Comments)    Dry mouth  . Cymbalta  [Duloxetine Hcl]     Other reaction(s): Other (See Comments) insomnia  . Duloxetine     Other reaction(s): Other (See Comments) insomnia    Social History   Socioeconomic History  . Marital status: Widowed    Spouse name: Not on file  . Number of children: Not on file  . Years of education: Not on file  . Highest education level: Not on file  Social Needs  . Financial resource  strain: Not on file  . Food insecurity - worry: Not on file  . Food insecurity - inability: Not on file  . Transportation needs - medical: Not on file  . Transportation needs - non-medical: Not on file  Occupational History  . Not on file  Tobacco Use  . Smoking status: Never Smoker  . Smokeless tobacco: Current User    Types: Snuff  . Tobacco comment: used 35 years  Substance and Sexual Activity  . Alcohol use: No  . Drug use: No  . Sexual activity: Not on file  Other Topics Concern  . Not on file  Social History Narrative  . Not on file     Family History  Problem Relation Age of Onset  . Healthy Mother   . Healthy Father   . Dementia Sister   . Breast cancer Neg Hx      Review of Systems: A 12-system review of systems was performed and is negative except as noted in the  HPI.  Labs: Recent Labs    10/31/17 1036 10/31/17 2007  TROPONINI <0.03 <0.03   Lab Results  Component Value Date   WBC 15.3 (H) 11/01/2017   HGB 12.4 11/01/2017   HCT 37.9 11/01/2017   MCV 85.9 11/01/2017   PLT 333 11/01/2017    Recent Labs  Lab 10/31/17 1036 11/01/17 0524  NA 136 139  K 3.7 3.8  CL 99* 101  CO2 23 22  BUN 29* 29*  CREATININE 1.53* 1.32*  CALCIUM 9.6 10.0  PROT 6.9  --   BILITOT 1.0  --   ALKPHOS 135*  --   ALT 49  --   AST 46*  --   GLUCOSE 147* 139*   Lab Results  Component Value Date   CHOL 129 10/31/2017   HDL 39 (L) 10/31/2017   LDLCALC 75 10/31/2017   TRIG 74 10/31/2017   No results found for: DDIMER  Radiology/Studies:  Dg Chest 1 View  Result Date: 10/20/2017 CLINICAL DATA:  84 year old female with nausea and vomiting. Breast cancer post lumpectomy. Hypertension. Nonsmoker. Initial encounter. EXAM: CHEST 1 VIEW COMPARISON:  04/12/2012. FINDINGS: Pulmonary edema bilateral pleural effusions greater on right. Basal atelectasis suspected. Heart size top-normal. Calcified aorta. No acute osseous abnormality. IMPRESSION: Pulmonary edema with bilateral  pleural effusions greater on right. Basilar subsegmental atelectasis. Aortic Atherosclerosis (ICD10-I70.0). Electronically Signed   By: Genia Del M.D.   On: 10/20/2017 07:58   Ct Head Wo Contrast  Result Date: 10/31/2017 CLINICAL DATA:  Altered mental status. EXAM: CT HEAD WITHOUT CONTRAST TECHNIQUE: Contiguous axial images were obtained from the base of the skull through the vertex without intravenous contrast. COMPARISON:  10/20/2017 FINDINGS: Brain: Old right parietal infarct again noted, unchanged. There is atrophy and chronic small vessel disease changes. No acute intracranial abnormality. Specifically, no hemorrhage, hydrocephalus, mass lesion, acute infarction, or significant intracranial injury. Vascular: No hyperdense vessel or unexpected calcification. Skull: No acute calvarial abnormality. Sinuses/Orbits: Visualized paranasal sinuses and mastoids clear. Orbital soft tissues unremarkable. Other: None IMPRESSION: No acute intracranial abnormality. Atrophy, chronic microvascular disease. Old right parietal infarct. Electronically Signed   By: Rolm Baptise M.D.   On: 10/31/2017 11:03   Ct Head Wo Contrast  Result Date: 10/20/2017 CLINICAL DATA:  84 year old female with sudden onset of nausea vomiting and weakness. Hypertension. Prior infarct. Initial encounter. EXAM: CT HEAD WITHOUT CONTRAST TECHNIQUE: Contiguous axial images were obtained from the base of the skull through the vertex without intravenous contrast. COMPARISON:  None. FINDINGS: Brain: No intracranial hemorrhage or CT evidence of large acute infarct. Remote posterior right frontal-parietal lobe infarct. Remote left lenticular nucleus/caudate infarct. Moderate chronic microvascular changes. Mild global atrophy. No intracranial mass lesion noted on this unenhanced exam. Prominent dural calcifications greater on left. Vascular: Vascular calcifications Skull: No skull fracture Sinuses/Orbits: Visualized orbital structures and paranasal  sinuses unremarkable. Other: Mastoid air cells and middle ear cavities are clear. IMPRESSION: No acute intracranial abnormality. Remote posterior right frontal-parietal lobe infarct. Remote left lenticular nucleus/caudate infarct. Moderate chronic microvascular changes. Electronically Signed   By: Genia Del M.D.   On: 10/20/2017 08:47   Ct Chest Wo Contrast  Result Date: 10/20/2017 CLINICAL DATA:  Acute respiratory illness with pleural effusion. EXAM: CT CHEST WITHOUT CONTRAST TECHNIQUE: Multidetector CT imaging of the chest was performed following the standard protocol without IV contrast. COMPARISON:  Chest x-ray from earlier today FINDINGS: Cardiovascular: Biatrial enlargement. No pericardial effusion. Atherosclerotic calcification of the aorta and coronaries. Large main pulmonary artery as seen  with pulmonary hypertension, 3.4 cm. Mediastinum/Nodes: Negative for adenopathy. There is a peripherally calcified nodule posteriorly from the left lobe thyroid measuring 2.2 cm. No associated adenopathy. Lungs/Pleura: Small left and more moderate right pleural effusions that are layering. Diffuse interlobular septal thickening and bronchial wall thickening. Atelectasis in the lower lobes with ground-glass density that could be alveolar edema or incomplete atelectasis. There is a focal ill-defined 2.1 cm dense opacity in the left upper lobe which is different than the other findings. Small subpleural pulmonary nodules bilaterally which will be seen at follow-up. Upper Abdomen: No acute finding. Musculoskeletal: L1 superior endplate fracture with sclerosis and horizontal fracture plane beneath the mildly depressed superior endplate. This is likely not acute but does not appear healed. No retropulsion. IMPRESSION: 1. Moderate right and small left layering pleural effusions with pulmonary edema and lower lobe atelectasis. Biatrial enlargement. 2. 3 cm spiculated opacity in the superior segment left lower lobe which  could be incidental neoplasm or pneumonia. Recommend follow-up noncontrast chest CT in 2 to 3 months. 3. L1 compression fracture that is likely subacute and unhealed. No retropulsion. Height loss is mild. 4. Aortic Atherosclerosis (ICD10-I70.0). Electronically Signed   By: Monte Fantasia M.D.   On: 10/20/2017 10:31   US Carotid Bilateral (at [facility] Only)  Result Date: 10/31/2017 CLINICAL DATA:  84 year old female with symptoms of cerebrovascular accident EXAM: BILATERAL CAROTID DUPLEX ULTRASOUND TECHNIQUE: Pearline Cables scale imaging, color Doppler and duplex ultrasound were performed of bilateral carotid and vertebral arteries in the neck. COMPARISON:  None. FINDINGS: Criteria: Quantification of carotid stenosis is based on velocity parameters that correlate the residual internal carotid diameter with NASCET-based stenosis levels, using the diameter of the distal internal carotid lumen as the denominator for stenosis measurement. The following velocity measurements were obtained: RIGHT ICA:  69/13 cm/sec CCA:  29/9 cm/sec SYSTOLIC ICA/CCA RATIO:  1.2 DIASTOLIC ICA/CCA RATIO:  1.5 ECA:  201 cm/sec LEFT ICA:  146/30 cm/sec CCA:  24/26 cm/sec SYSTOLIC ICA/CCA RATIO:  2.2 DIASTOLIC ICA/CCA RATIO:  5.5 ECA:  73 cm/sec RIGHT CAROTID ARTERY: Heterogeneous atherosclerotic plaque in the distal common carotid artery and proximal internal carotid artery. By peak systolic velocity criteria, the estimated stenosis remains less than 50%. RIGHT VERTEBRAL ARTERY:  Patent with normal antegrade flow. LEFT CAROTID ARTERY: Heterogeneous and irregular atherosclerotic plaque throughout the left common carotid artery extending into the proximal internal carotid artery. By peak systolic velocity criteria, the estimated stenosis falls in the 50-69% range. LEFT VERTEBRAL ARTERY:  Patent with normal antegrade flow. IMPRESSION: 1. Moderate (50-69%) stenosis proximal left internal carotid artery secondary to heterogeneous and irregular  atherosclerotic plaque. 2. Mild (1-49%) stenosis proximal right internal carotid artery secondary to heterogenous atherosclerotic plaque. 3. Vertebral arteries are patent with antegrade flow. Signed, Criselda Peaches, MD [facility] Electronically Signed   By: Jacqulynn Cadet M.D.   On: 10/31/2017 14:57   Dg Chest Port 1 View  Result Date: 10/31/2017 CLINICAL DATA:  Atrial fibrillation EXAM: PORTABLE CHEST 1 VIEW COMPARISON:  10/20/2017 FINDINGS: Moderate bilateral pleural effusions, increasing since prior study. Cardiomegaly with bilateral airspace opacities compatible with edema/CHF, worsening. IMPRESSION: Worsening CHF and bilateral effusions. Electronically Signed   By: Rolm Baptise M.D.   On: 10/31/2017 16:51    Wt Readings from Last 3 Encounters:  10/20/17 70.8 kg (156 lb)  10/02/17 70.8 kg (156 lb)  07/16/17 70.8 kg (156 lb)    EKG: Atrial fibrillation with variable but fairly rapid  ventricular response  Physical Exam: Minimally responsive Caucasian female.  She does respond to verbal command however. Blood pressure 113/82, pulse (!) 115, temperature 98.1 F (36.7 C), temperature source Axillary, resp. rate (!) 38, SpO2 92 %. There is no height or weight on file to calculate BMI. General: Well developed, well nourished, in no acute distress. Head: Normocephalic, atraumatic, sclera non-icteric, no xanthomas, nares are without discharge.  Neck: Left carotid bruit.  JVD not elevated. Lungs: Clear bilaterally to auscultation without wheezes, rales, or rhonchi. Breathing is unlabored. Heart: irr, irr with S1 S2.  No obvious murmurs this morning. Abdomen: Soft, non-tender, non-distended with normoactive bowel sounds. No hepatomegaly. No rebound/guarding. No obvious abdominal masses. Msk: Left-sided neglect. Extremities: No clubbing or cyanosis. No edema.  Distal pedal pulses are 2+ and equal bilaterally. Neuro: Minimally  responsive with left-sided weakness..       Assessment and Plan  84 year old female with history of persistent atrial fibrillation which was treated with rate control and not anticoagulated due to a history of falls at home making chronic anticoagulation somewhat high risk.  She had been on amlodipine, metoprolol, lisinopril-hydrochlorothiazide for blood pressure.  She was on aspirin and atorvastatin.  Per her daughter, patient had stopped taking all of her medications except for aspirin.  She presented with several day history of altered mental status with acute left-sided neglect and right lateral gaze.  Blood pressure appear to be fairly well controlled in the emergency room although she had A. fib with rapid ventricular response.  Brain CT without contrast was negative for CVA or acute change from one done in January 29 of this year.  Carotid Dopplers revealed bilateral carotid plaque however less than 70%.  Brain MRI was not obtainable secondary to patient agitation and movement.  A. fib rate appears fairly well controlled after receiving digoxin.  She does not a candidate ideally for chronic anticoagulation again as mentioned above due to falling and concern over intracranial bleed.  Will continue to attempt rate control with digoxin but will attempt to add back metoprolol as this will be more effective than digoxin.  We will follow hemodynamics and add metoprolol back as tolerated.  Await further neurologic evaluation and improvement.  Blood pressure is currently controlled.  Patient unable to take p.o. as of yet until swallowing screen can be completed.  Will need IV medications for now.  Ivin Booty MD 11/01/2017, 9:07 AM Pager: [phone]

## 2017-11-01 NOTE — Plan of Care (Signed)
Patient is alert to self only and unable to retain education. No family present at this time. Will forward on to oncoming day RN in the morning, to discuss stroke care plan and education with family.

## 2017-11-01 NOTE — Progress Notes (Addendum)
John from MRI called to question if pt was going to be sent for MRI that was unable to be completed on day shift. Confirmed with Ronalee Belts that patient continues to be confused, currently requiring tele sitter and in hand mitts. Also spoke with Burman Nieves, NP to confirm that patient is not to be sent for MRI tonight.

## 2017-11-01 NOTE — Progress Notes (Signed)
[facility] [place] NAME: Emily Moyer    MR#:  [id number]  DATE OF BIRTH:  1923-12-23  SUBJECTIVE:  CHIEF COMPLAINT:   Chief Complaint  Patient presents with  . Altered Mental Status  Patient is nonverbal, in discussion with intensivist-unable to do MRI/possible transfer to the floor later today, neurology input appreciated  REVIEW OF SYSTEMS:  CONSTITUTIONAL: No fever, fatigue or weakness.  EYES: No blurred or double vision.  EARS, NOSE, AND THROAT: No tinnitus or ear pain.  RESPIRATORY: No cough, shortness of breath, wheezing or hemoptysis.  CARDIOVASCULAR: No chest pain, orthopnea, edema.  GASTROINTESTINAL: No nausea, vomiting, diarrhea or abdominal pain.  GENITOURINARY: No dysuria, hematuria.  ENDOCRINE: No polyuria, nocturia,  HEMATOLOGY: No anemia, easy bruising or bleeding SKIN: No rash or lesion. MUSCULOSKELETAL: No joint pain or arthritis.   NEUROLOGIC: No tingling, numbness, weakness.  PSYCHIATRY: No anxiety or depression.   ROS  DRUG ALLERGIES:   Allergies  Allergen Reactions  . Aspirin-Dipyridamole Er     Other reaction(s): Unknown  . Dipyridamole     Other reaction(s): Unknown  . Lyrica [Pregabalin] Swelling  . Clonidine Other (See Comments)    Dry mouth  . Cymbalta  [Duloxetine Hcl]     Other reaction(s): Other (See Comments) insomnia  . Duloxetine     Other reaction(s): Other (See Comments) insomnia    VITALS:  Blood pressure (!) 111/55, pulse 100, temperature 98.9 F (37.2 C), temperature source Axillary, resp. rate (!) 28, SpO2 94 %.  PHYSICAL EXAMINATION:  GENERAL:  84 y.o.-year-old patient lying in the bed with no acute distress.  EYES: Pupils equal, round, reactive to light and accommodation. No scleral icterus. Extraocular muscles intact.  HEENT: Head atraumatic, normocephalic. Oropharynx and nasopharynx clear.  NECK:  Supple, no jugular venous distention. No thyroid enlargement, no  tenderness.  LUNGS: Normal breath sounds bilaterally, no wheezing, rales,rhonchi or crepitation. No use of accessory muscles of respiration.  CARDIOVASCULAR: S1, S2 normal. No murmurs, rubs, or gallops.  ABDOMEN: Soft, nontender, nondistended. Bowel sounds present. No organomegaly or mass.  EXTREMITIES: No pedal edema, cyanosis, or clubbing.  NEUROLOGIC: Cranial nerves II through XII are intact. Muscle strength 5/5 in all extremities. Sensation intact. Gait not checked.  PSYCHIATRIC: The patient is alert and oriented x 3.  SKIN: No obvious rash, lesion, or ulcer.   Physical Exam LABORATORY PANEL:   CBC Recent Labs  Lab 11/01/17 0524  WBC 15.3*  HGB 12.4  HCT 37.9  PLT 333   ------------------------------------------------------------------------------------------------------------------  Chemistries  Recent Labs  Lab 10/31/17 1036 11/01/17 0524  NA 136 139  K 3.7 3.8  CL 99* 101  CO2 23 22  GLUCOSE 147* 139*  BUN 29* 29*  CREATININE 1.53* 1.32*  CALCIUM 9.6 10.0  MG  --  1.5*  AST 46*  --   ALT 49  --   ALKPHOS 135*  --   BILITOT 1.0  --    ------------------------------------------------------------------------------------------------------------------  Cardiac Enzymes Recent Labs  Lab 10/31/17 1036 10/31/17 2007  TROPONINI <0.03 <0.03   ------------------------------------------------------------------------------------------------------------------  RADIOLOGY:  Ct Head Wo Contrast  Result Date: 10/31/2017 CLINICAL DATA:  Altered mental status. EXAM: CT HEAD WITHOUT CONTRAST TECHNIQUE: Contiguous axial images were obtained from the base of the skull through the vertex without intravenous contrast. COMPARISON:  10/20/2017 FINDINGS: Brain: Old right parietal infarct again noted, unchanged. There is atrophy and chronic small vessel disease changes. No acute intracranial abnormality. Specifically, no hemorrhage, hydrocephalus, mass  lesion, acute infarction, or  significant intracranial injury. Vascular: No hyperdense vessel or unexpected calcification. Skull: No acute calvarial abnormality. Sinuses/Orbits: Visualized paranasal sinuses and mastoids clear. Orbital soft tissues unremarkable. Other: None IMPRESSION: No acute intracranial abnormality. Atrophy, chronic microvascular disease. Old right parietal infarct. Electronically Signed   By: Rolm Baptise M.D.   On: 10/31/2017 11:03   US Carotid Bilateral (at [facility] Only)  Result Date: 10/31/2017 CLINICAL DATA:  84 year old female with symptoms of cerebrovascular accident EXAM: BILATERAL CAROTID DUPLEX ULTRASOUND TECHNIQUE: Pearline Cables scale imaging, color Doppler and duplex ultrasound were performed of bilateral carotid and vertebral arteries in the neck. COMPARISON:  None. FINDINGS: Criteria: Quantification of carotid stenosis is based on velocity parameters that correlate the residual internal carotid diameter with NASCET-based stenosis levels, using the diameter of the distal internal carotid lumen as the denominator for stenosis measurement. The following velocity measurements were obtained: RIGHT ICA:  69/13 cm/sec CCA:  25/8 cm/sec SYSTOLIC ICA/CCA RATIO:  1.2 DIASTOLIC ICA/CCA RATIO:  1.5 ECA:  201 cm/sec LEFT ICA:  146/30 cm/sec CCA:  52/77 cm/sec SYSTOLIC ICA/CCA RATIO:  2.2 DIASTOLIC ICA/CCA RATIO:  5.5 ECA:  73 cm/sec RIGHT CAROTID ARTERY: Heterogeneous atherosclerotic plaque in the distal common carotid artery and proximal internal carotid artery. By peak systolic velocity criteria, the estimated stenosis remains less than 50%. RIGHT VERTEBRAL ARTERY:  Patent with normal antegrade flow. LEFT CAROTID ARTERY: Heterogeneous and irregular atherosclerotic plaque throughout the left common carotid artery extending into the proximal internal carotid artery. By peak systolic velocity criteria, the estimated stenosis falls in the 50-69% range. LEFT VERTEBRAL ARTERY:  Patent with normal antegrade flow. IMPRESSION: 1.  Moderate (50-69%) stenosis proximal left internal carotid artery secondary to heterogeneous and irregular atherosclerotic plaque. 2. Mild (1-49%) stenosis proximal right internal carotid artery secondary to heterogenous atherosclerotic plaque. 3. Vertebral arteries are patent with antegrade flow. Signed, Criselda Peaches, MD [facility] Radiology Electronically Signed   By: Jacqulynn Cadet M.D.   On: 10/31/2017 14:57   Dg Chest Port 1 View  Result Date: 10/31/2017 CLINICAL DATA:  Atrial fibrillation EXAM: PORTABLE CHEST 1 VIEW COMPARISON:  10/20/2017 FINDINGS: Moderate bilateral pleural effusions, increasing since prior study. Cardiomegaly with bilateral airspace opacities compatible with edema/CHF, worsening. IMPRESSION: Worsening CHF and bilateral effusions. Electronically Signed   By: Rolm Baptise M.D.   On: 10/31/2017 16:51    ASSESSMENT AND PLAN:  Patient is a 84 year old white female with history of atrial fibrillation presenting with strokelike symptoms  1.  Acute CVA Was admitted to the ICU on day of admission, neurology input appreciated, unable to do MRI of the brain, continue CVA protocol, echocardiogram noted for ejection fraction 55-65%, follow-up on carotid Dopplers, patient is not a good candidate for anticoagulation-cardiology agrees   2.    Acute A. fib with RVR stable Stable Cardiology input appreciated, patient is a poor candidate for anticoagulation, continue Cardizem drip, did receive digoxin while in house, vitals per routine, make changes as per necessary Echocardiogram noted above  3.  Essential hypertension, chronic Continue to allow for permissive hypertension, vitals per routine, make changes as per necessary  4.  Medical noncompliance Noted previous admission patient left AMA DOS involved, psychiatry did see patient while in house - pt is non-verbal/refusing to take meds  5.  Chronic noncompliance of medical  management Long-term prognosis poor given noncompliance  All the records are reviewed and case discussed with Care Management/Social Workerr. Management plans discussed with the  patient, family and they are in agreement.  CODE STATUS: dnr  TOTAL TIME TAKING CARE OF THIS PATIENT: 35 minutes.     POSSIBLE D/C IN 2-5 DAYS, DEPENDING ON CLINICAL CONDITION.   Avel Peace Tashara Suder M.D on 11/01/2017   Between 7am to 6pm - Pager - [phone]  After 6pm go to [email or web address] - password [email or web address] [facility]  Office  [phone]  CC: Primary care physician; Ezequiel Kayser, MD  Note: This dictation was prepared with Dragon dictation along with smaller phrase technology. Any transcriptional errors that result from this process are unintentional.

## 2017-11-01 NOTE — Consult Note (Addendum)
  I was asked to evaluate Emily Moyer for capacity, to do what? Apparently, the patient refuses to take any medications except for ASA. She has a history of atrial fibrillation with resulting stroke.   I was unable to interview the patient today. She is nonverbal. Spoke to her nurse.   Spoke with her daughter who is trying to respect her mother's wishes. She admits that the mother "started getting dementia" but has been active around the house. The daughter works but she could have someone to take care of her mother during the day. Reportedly, the patient refused to see her cardiologist last week.   Today, the patient is unable to make any decisions due to her medical condition.

## 2017-11-01 NOTE — Progress Notes (Signed)
PT Cancellation Note  Patient Details Name: Emily Moyer MRN: [id number] DOB: [birth year]   Cancelled Treatment:    Reason Eval/Treat Not Completed: Patient's level of consciousness.  Pt is combative at times, not alert and would not be able to get up out of bed.  Nursing and PT discussed waiting another day to try and catch pt in a better state for participation.   Ramond Dial 11/01/2017, 4:29 PM   Mee Hives, PT MS Acute Rehab Dept. Number: [place] and [place]

## 2017-11-02 ENCOUNTER — Inpatient Hospital Stay: Payer: Medicare Other

## 2017-11-02 ENCOUNTER — Encounter: Payer: Self-pay | Admitting: *Deleted

## 2017-11-02 DIAGNOSIS — I6389 Other cerebral infarction: Secondary | ICD-10-CM

## 2017-11-02 LAB — BASIC METABOLIC PANEL
ANION GAP: 14 (ref 5–15)
BUN: 29 mg/dL — AB (ref 6–20)
CALCIUM: 9.8 mg/dL (ref 8.9–10.3)
CO2: 24 mmol/L (ref 22–32)
Chloride: 99 mmol/L — ABNORMAL LOW (ref 101–111)
Creatinine, Ser: 1.33 mg/dL — ABNORMAL HIGH (ref 0.44–1.00)
GFR calc Af Amer: 39 mL/min — ABNORMAL LOW (ref >60)
GFR, EST NON AFRICAN AMERICAN: 34 mL/min — AB (ref >60)
GLUCOSE: 135 mg/dL — AB (ref 65–99)
POTASSIUM: 3.2 mmol/L — AB (ref 3.5–5.1)
SODIUM: 137 mmol/L (ref 135–145)

## 2017-11-02 LAB — PHOSPHORUS: Phosphorus: 3 mg/dL (ref 2.5–4.6)

## 2017-11-02 LAB — MAGNESIUM: MAGNESIUM: 2.2 mg/dL (ref 1.7–2.4)

## 2017-11-02 MED ORDER — TRAZODONE HCL 50 MG PO TABS: 50.0000 mg | ORAL_TABLET | Freq: Every day | ORAL | Status: DC

## 2017-11-02 MED ORDER — ATORVASTATIN CALCIUM 20 MG PO TABS: 40.0000 mg | ORAL_TABLET | Freq: Every day | ORAL | Status: DC

## 2017-11-02 MED ORDER — IOPAMIDOL (ISOVUE-370) INJECTION 76%
60.0000 mL | Freq: Once | INTRAVENOUS | Status: AC | PRN
  Administered 2017-11-02: 60 mL via INTRAVENOUS

## 2017-11-02 MED ORDER — PREGABALIN 25 MG PO CAPS: 50.0000 mg | ORAL_CAPSULE | Freq: Two times a day (BID) | ORAL | Status: DC

## 2017-11-02 MED ORDER — POTASSIUM CHLORIDE 10 MEQ/100ML IV SOLN
10.0000 meq | INTRAVENOUS | Status: AC
  Administered 2017-11-02 (×4): 10 meq via INTRAVENOUS
  Filled 2017-11-02 (×4): qty 100

## 2017-11-02 MED ORDER — VITAMIN D 1000 UNITS PO TABS: 1000.0000 [IU] | ORAL_TABLET | Freq: Every day | ORAL | Status: DC

## 2017-11-02 MED ORDER — METOPROLOL SUCCINATE ER 50 MG PO TB24: 100.0000 mg | ORAL_TABLET | Freq: Every day | ORAL | Status: DC

## 2017-11-02 MED ORDER — POTASSIUM CHLORIDE 2 MEQ/ML IV SOLN: INTRAVENOUS | Status: DC

## 2017-11-02 MED ORDER — TRAMADOL HCL 50 MG PO TABS: 50.0000 mg | ORAL_TABLET | Freq: Four times a day (QID) | ORAL | Status: DC | PRN

## 2017-11-02 NOTE — Evaluation (Signed)
Clinical/Bedside Swallow Evaluation Patient Details  Name: Emily Moyer MRN: [id number] Date of Birth: 24-Aug-1924  Today's Date: 11/02/2017 Time: SLP Start Time (ACUTE ONLY): 0830 SLP Stop Time (ACUTE ONLY): 0930 SLP Time Calculation (min) (ACUTE ONLY): 60 min  Past Medical History:  Past Medical History:  Diagnosis Date  . Arthritis   . B12 deficiency 03/18/2014  . Benign essential HTN 03/18/2014  . Benign neoplasm of skin of upper limb, including shoulder   . Breast cancer ([place]) 12/02/02   lumpectomy/rad-positive  . 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 Colon polyp   . Hypertension   . Malignant neoplasm of lower-inner quadrant of female breast ([place])    11/03/2002: T1 C., N0 invasive mammary carcinoma, ER-positive, HER-2/neu not overexpressing. Treated with wide excision and sentinel node biopsy.  . Malignant neoplasm of upper limb ([facility])    left thumb, T1, N0 treated by partial thumb amputation, sentinel node biopsy.  Marland Kitchen Neuropathy    legs  . Peripheral nerve disease 03/18/2014  . SCC (squamous cell carcinoma), arm 05/05/2013   Overview:  Overview:  Left   . Skin cancer   . Stroke [facility])    Past Surgical History:  Past Surgical History:  Procedure Laterality Date  . ABDOMINAL HYSTERECTOMY    . biopsy thumb Left 2013    0.4 x 1.8 x 1.8 cm histologic grade 2 invasive squamous cell. 7 mm from in situ carcinoma to resection, 1.2 cm from invasive cancer. Negative margins.pT1,N0  . BREAST BIOPSY Right 10/23/02   positive  . BREAST LUMPECTOMY Right 2004  . CHOLECYSTECTOMY    . COLONOSCOPY  2008  . COLONOSCOPY W/ POLYPECTOMY  06/23/2006 transverse colon tubular adenoma without high-grade dysplasia.  Marland Kitchen THUMB AMPUTATION Left 04/19/2012   Squamous cell carcinoma   HPI:  Pt is a 84 y.o. female with a known history of multiple medical issues including CVA, memory issues, chronic atrial fibrillation only on aspirin due to falls, essential hypertension previous CVA who was actually  hospitalized here recently with a UTI and pneumonia who left AGAINST MEDICAL ADVICE recently.  She is brought in by her daughter because she was found sitting on the side of her bed with right-sided gaze deviation, slurred speech and left-sided neglect by her daughter.  Patient does have a slurred speech she wants to go home.  According to her daughter her mother is noncompliant with all her medications other than aspirin. Pt just had a recent admission w/ dx'd pnuemonia and UTI per chart. Unable to obtain MRI due to desaturation and agitation. Pt has been coughing up phlegm per NSG report. HR remains elevated; Cardiology following.    Assessment / Plan / Recommendation Clinical Impression  Pt appears to present w/ severe oropharyngeal phase dysphagia w/ high risk for aspiration of any oral intake. Pt exhibited pronounced oral phase deficits w/ ice chip trials c/b little to no lingual movement to manipulate and transfer bolus material, no labial closure for bolus management, poor bolus awareness once ice chip trial was placed anteriorly in mouth(moreso on Right side of mouth). Pt allowed bolus material(water then) to lay in the mouth w/ no noted attempts to manipulate posteriorly for swallowing; no pharyngeal swallow was appreciated given ~2 mins. A delayed, congsted cough followed however. PO trials were stopped d/t high risk for aspiration. Noted same decreased lingual movement/response during oral care provided; pt frequently mashed down on the swabs despite cues. Max cues given throughout d/t pt's declined Cognitive status/confusion. Pt gave muttered Games developer; dysarthria appreciated  d/t the decreased lingual movement in general. DUe to pt's high risk for aspiration, recommend continued NPO status w/ frequent oral care for hygiene and stimulation of swallowing. ST services will f/u tomorrow for ongoing assessment of swallow funtion and safety for oral diet.  SLP Visit Diagnosis: Dysphagia,  oropharyngeal phase (R13.12)    Aspiration Risk  Severe aspiration risk;Risk for inadequate nutrition/hydration    Diet Recommendation  NPO status w/ frequent oral care for hygiene and stimulation of swallowing; aspiration preacutions  Medication Administration: Via alternative means    Other  Recommendations Recommended Consults: (Dietician following) Oral Care Recommendations: Oral care QID;Staff/trained caregiver to provide oral care(aspiration precautions) Other Recommendations: (TBD)   Follow up Recommendations Skilled Nursing facility(TBD)      Frequency and Duration min 3x week  2 weeks       Prognosis Prognosis for Safe Diet Advancement: Guarded Barriers to Reach Goals: Cognitive deficits;Severity of deficits      Swallow Study   General Date of Onset: 10/31/17 HPI: Pt is a 84 y.o. female with a known history of multiple medical issues including CVA, memory issues, chronic atrial fibrillation only on aspirin due to falls, essential hypertension previous CVA who was actually hospitalized here recently with a UTI and pneumonia who left AGAINST MEDICAL ADVICE recently.  She is brought in by her daughter because she was found sitting on the side of her bed with right-sided gaze deviation, slurred speech and left-sided neglect by her daughter.  Patient does have a slurred speech she wants to go home.  According to her daughter her mother is noncompliant with all her medications other than aspirin. Pt just had a recent admission w/ dx'd pnuemonia and UTI per chart. Unable to obtain MRI due to desaturation and agitation. Pt has been coughing up phlegm per NSG report. HR remains elevated; Cardiology following.  Type of Study: Bedside Swallow Evaluation Previous Swallow Assessment: none reported Diet Prior to this Study: (unknown) Temperature Spikes Noted: Yes(99.6;  wbc 15.3) Respiratory Status: Nasal cannula(4-5 liters) History of Recent Intubation: No Behavior/Cognition:  Alert;Cooperative;Pleasant mood;Confused Oral Cavity Assessment: Dry;Excessive secretions;Erythema(difficult to assess d/t pt closing mouth on swabs) Oral Care Completed by SLP: Yes(attempted) Oral Cavity - Dentition: Edentulous Vision: (n/a) Self-Feeding Abilities: Total assist Patient Positioning: Upright in bed Baseline Vocal Quality: Low vocal intensity(muttered speech) Volitional Cough: Cognitively unable to elicit Volitional Swallow: Unable to elicit    Oral/Motor/Sensory Function Overall Oral Motor/Sensory Function: Severe impairment(difficult to assess but limited ROM/strength of tongue) Lingual ROM: (reduced overall) Lingual Symmetry: Within Functional Limits Lingual Strength: Reduced(significantly)   Ice Chips Ice chips: Impaired Presentation: Spoon(2 trials) Oral Phase Impairments: Reduced labial seal;Reduced lingual movement/coordination;Poor awareness of bolus Oral Phase Functional Implications: Oral residue;Oral holding;Prolonged oral transit Pharyngeal Phase Impairments: Cough - Delayed(no pharyngeal swallow appreciated) Other Comments: max cues given   Thin Liquid Thin Liquid: Not tested    Nectar Thick Nectar Thick Liquid: Not tested   Honey Thick Honey Thick Liquid: Not tested   Puree Puree: Not tested   Solid   GO   Solid: Not tested         Orinda Kenner, MS, CCC-SLP Watson,Katherine 11/02/2017,3:29 PM

## 2017-11-02 NOTE — Progress Notes (Signed)
Follow up - Critical Care Medicine Note  Patient Details:    Emily Moyer is an 84 y.o. female.  Emily Moyer is a 84 year old female with a past medical history remarkable for hypertension, diabetes, neuropathy, CVA, breast carcinoma, atrial fibrillation on aspirin due to falls,receently hospitalized here recently with a UTI and pneumonia who left AGAINST MEDICAL ADVICE recently. She is brought in by her daughter because she was found sitting on the side of her bed with right-sided gaze deviation, slurred speech and left-sided neglect by her daughter.    CC follow up mental status changes  HPI Stuperous, unable to follow commands Patient is DNR/DNI    Consults: Treatment Team:  Teodoro Spray, MD Clovis Fredrickson, MD Leotis Pain, MD    Objective:  Vital signs for last 24 hours: Temp:  [98.3 F (36.8 C)-99.6 F (37.6 C)] 99.6 F (37.6 C) (02/11 0200) Pulse Rate:  [92-138] 121 (02/11 0600) Resp:  [21-32] 23 (02/11 0600) BP: (98-148)/(47-91) 124/69 (02/11 0600) SpO2:  [90 %-95 %] 95 % (02/11 0600)    Intake/Output from previous day: 02/10 0701 - 02/11 0700 In: 360 [I.V.:360] Out: 615 [Urine:615]  Intake/Output this shift: No intake/output data recorded.   Review of Systems: Limited due to mental decline   Physical Exam:  Patient is awake, alert and responds, she has left-sided neglect with rightward gaze, stuperous  HEENT: No respiratory distress, trachea is midline, left-sided facial droop Cardiovascular: Patient with atrial fibrillation, irregularly irregular rhythm, ventricular responses in the low 100s Pulmonary: Scant basilar crackles appreciated right greater than left Abdominal: Positive bowel sounds, soft exam Extremities: No clubbing cyanosis or edema noted Neurologic: Patient with right-sided gaze, left-sided neglect with facial asymmetry does not remove all extremities  Assessment/Plan:   84 yo female with history of afib  not anticoagulated due to comorbidity and fall history who was admitted after being noted to have right sided gaze preference and left sided neglect c/w with cva. Brain ct was unremarkable for bleed or new change. MRI not done due to patient inability to remain still.     CVA.  Patient admitted to the intensive care unit secondary to desaturation while trying to obtain MRI.   was unable to get MRI secondary to desaturation. Pending neurology input complaining of MRI follow-up.   Congestive heart failure. Patient diuresed 1.1 L. Presently on nasal cannula   Rapid atrial fibrillation.  Patient starting on a Cardizem infusion for rate control  Hypertension. Blood pressure stable as morning  Leukocytosis. Leukocytosis of 15.3. No clear evidence of infection noted  Renal insufficiency. BUN/creatinine is 29/1.32 essentially the same. Will follow   Pending neurology input after consultation will consider for transfer   Overall prognosis is poor Recommend palliative care consult and hospice  Arienne Gartin Patricia Pesa, M.D.  [facility]  Medical Director [facility] Director [facility] Cardio-Pulmonary Department

## 2017-11-02 NOTE — Consult Note (Signed)
Psychiatry: Brief follow-up.  84 year old woman with multiple medical problems now diagnosed with new strokes.  Spoke with nursing.  Family was visiting with patient did not attempt to interact with her.  The nursing staff tells me the patient is nearly unresponsive and nonverbal no better than yesterday.  Clearly lacks all capacity to make any decisions at this point.  No other psychiatric needs.

## 2017-11-02 NOTE — Progress Notes (Signed)
OT Cancellation Note  Patient Details Name: Emily Moyer MRN: [id number] DOB: 09/24/1923   Cancelled Treatment:    Reason Eval/Treat Not Completed: Patient not medically ready. Order received, chart reviewed. HR continues to be elevated (last 128bpm), on cardizem drip. Spoke with RN. Pt confused, not appropriate for therapy this date. Will re-attempt OT evaluation next date as pt is medically appropriate.   Jeni Salles, MPH, MS, OTR/L ascom [phone] 11/02/17, 1:36 PM

## 2017-11-02 NOTE — Progress Notes (Signed)
Patient Name: Emily Moyer Date of Encounter: 11/02/2017  Hospital Problem List     Active Problems:   CVA (cerebral vascular accident) [facility])    Patient Profile     84 with afib s/p cva  Subjective   Minimally responsive.   Inpatient Medications    .  stroke: mapping our early stages of recovery book   Does not apply Once  . chlorhexidine  15 mL Mouth Rinse BID  . digoxin  0.13 mg Intravenous Once  . enoxaparin (LOVENOX) injection  30 mg Subcutaneous Q24H  . LORazepam  2 mg Intravenous Once  . mouth rinse  15 mL Mouth Rinse q12n4p    Vital Signs    Vitals:   11/02/17 0307 11/02/17 0400 11/02/17 0500 11/02/17 0600  BP: (!) 148/91 126/62 127/70 124/69  Pulse: (!) 132 (!) 121 (!) 118 (!) 121  Resp: (!) 31 (!) 22 (!) 22 (!) 23  Temp:      TempSrc:      SpO2: 93% 95% 95% 95%    Intake/Output Summary (Last 24 hours) at 11/02/2017 0818 Last data filed at 11/02/2017 0617 Gross per 24 hour  Intake 360 ml  Output 490 ml  Net -130 ml   There were no vitals filed for this visit.  Physical Exam    GEN: Well nourished, well developed, in no acute distress.  HEENT: normal.  Neck: Supple, no JVD, carotid bruits, or masses. Cardiac: RRR, no murmurs, rubs, or gallops. No clubbing, cyanosis, edema.  Radials/DP/PT 2+ and equal bilaterally.  Respiratory:  Respirations regular and unlabored, clear to auscultation bilaterally. GI: Soft, nontender, nondistended, BS + x 4. MS: no deformity or atrophy. Skin: warm and dry, no rash. Neuro:  Strength and sensation are intact. Psych: Normal affect.  Labs    CBC Recent Labs    10/31/17 1036 11/01/17 0524  WBC 13.0* 15.3*  HGB 12.3 12.4  HCT 37.9 37.9  MCV 86.0 85.9  PLT 328 924   Basic Metabolic Panel Recent Labs    11/01/17 0524 11/02/17 0516  NA 139 137  K 3.8 3.2*  CL 101 99*  CO2 22 24  GLUCOSE 139* 135*  BUN 29* 29*  CREATININE 1.32* 1.33*  CALCIUM 10.0 9.8  MG 1.5* 2.2  PHOS 3.4 3.0   Liver  Function Tests Recent Labs    10/31/17 1036  AST 46*  ALT 49  ALKPHOS 135*  BILITOT 1.0  PROT 6.9  ALBUMIN 3.5   No results for input(s): LIPASE, AMYLASE in the last 72 hours. Cardiac Enzymes Recent Labs    10/31/17 1036 10/31/17 2007  TROPONINI <0.03 <0.03   BNP No results for input(s): BNP in the last 72 hours. D-Dimer No results for input(s): DDIMER in the last 72 hours. Hemoglobin A1C Recent Labs    10/31/17 1036  HGBA1C 6.2*   Fasting Lipid Panel Recent Labs    10/31/17 2007  CHOL 129  HDL 39*  LDLCALC 75  TRIG 74  CHOLHDL 3.3   Thyroid Function Tests No results for input(s): TSH, T4TOTAL, T3FREE, THYROIDAB in the last 72 hours.  Invalid input(s): FREET3  Telemetry    afib  ECG    afib  Radiology    Dg Chest 1 View  Result Date: 10/20/2017 CLINICAL DATA:  84 year old female with nausea and vomiting. Breast cancer post lumpectomy. Hypertension. Nonsmoker. Initial encounter. EXAM: CHEST 1 VIEW COMPARISON:  04/12/2012. FINDINGS: Pulmonary edema bilateral pleural effusions greater on right. Basal atelectasis suspected.  Heart size top-normal. Calcified aorta. No acute osseous abnormality. IMPRESSION: Pulmonary edema with bilateral pleural effusions greater on right. Basilar subsegmental atelectasis. Aortic Atherosclerosis (ICD10-I70.0). Electronically Signed   By: Genia Del M.D.   On: 10/20/2017 07:58   Ct Head Wo Contrast  Result Date: 10/31/2017 CLINICAL DATA:  Altered mental status. EXAM: CT HEAD WITHOUT CONTRAST TECHNIQUE: Contiguous axial images were obtained from the base of the skull through the vertex without intravenous contrast. COMPARISON:  10/20/2017 FINDINGS: Brain: Old right parietal infarct again noted, unchanged. There is atrophy and chronic small vessel disease changes. No acute intracranial abnormality. Specifically, no hemorrhage, hydrocephalus, mass lesion, acute infarction, or significant intracranial injury. Vascular: No hyperdense  vessel or unexpected calcification. Skull: No acute calvarial abnormality. Sinuses/Orbits: Visualized paranasal sinuses and mastoids clear. Orbital soft tissues unremarkable. Other: None IMPRESSION: No acute intracranial abnormality. Atrophy, chronic microvascular disease. Old right parietal infarct. Electronically Signed   By: Rolm Baptise M.D.   On: 10/31/2017 11:03   Ct Head Wo Contrast  Result Date: 10/20/2017 CLINICAL DATA:  84 year old female with sudden onset of nausea vomiting and weakness. Hypertension. Prior infarct. Initial encounter. EXAM: CT HEAD WITHOUT CONTRAST TECHNIQUE: Contiguous axial images were obtained from the base of the skull through the vertex without intravenous contrast. COMPARISON:  None. FINDINGS: Brain: No intracranial hemorrhage or CT evidence of large acute infarct. Remote posterior right frontal-parietal lobe infarct. Remote left lenticular nucleus/caudate infarct. Moderate chronic microvascular changes. Mild global atrophy. No intracranial mass lesion noted on this unenhanced exam. Prominent dural calcifications greater on left. Vascular: Vascular calcifications Skull: No skull fracture Sinuses/Orbits: Visualized orbital structures and paranasal sinuses unremarkable. Other: Mastoid air cells and middle ear cavities are clear. IMPRESSION: No acute intracranial abnormality. Remote posterior right frontal-parietal lobe infarct. Remote left lenticular nucleus/caudate infarct. Moderate chronic microvascular changes. Electronically Signed   By: Genia Del M.D.   On: 10/20/2017 08:47   Ct Chest Wo Contrast  Result Date: 10/20/2017 CLINICAL DATA:  Acute respiratory illness with pleural effusion. EXAM: CT CHEST WITHOUT CONTRAST TECHNIQUE: Multidetector CT imaging of the chest was performed following the standard protocol without IV contrast. COMPARISON:  Chest x-ray from earlier today FINDINGS: Cardiovascular: Biatrial enlargement. No pericardial effusion. Atherosclerotic  calcification of the aorta and coronaries. Large main pulmonary artery as seen with pulmonary hypertension, 3.4 cm. Mediastinum/Nodes: Negative for adenopathy. There is a peripherally calcified nodule posteriorly from the left lobe thyroid measuring 2.2 cm. No associated adenopathy. Lungs/Pleura: Small left and more moderate right pleural effusions that are layering. Diffuse interlobular septal thickening and bronchial wall thickening. Atelectasis in the lower lobes with ground-glass density that could be alveolar edema or incomplete atelectasis. There is a focal ill-defined 2.1 cm dense opacity in the left upper lobe which is different than the other findings. Small subpleural pulmonary nodules bilaterally which will be seen at follow-up. Upper Abdomen: No acute finding. Musculoskeletal: L1 superior endplate fracture with sclerosis and horizontal fracture plane beneath the mildly depressed superior endplate. This is likely not acute but does not appear healed. No retropulsion. IMPRESSION: 1. Moderate right and small left layering pleural effusions with pulmonary edema and lower lobe atelectasis. Biatrial enlargement. 2. 3 cm spiculated opacity in the superior segment left lower lobe which could be incidental neoplasm or pneumonia. Recommend follow-up noncontrast chest CT in 2 to 3 months. 3. L1 compression fracture that is likely subacute and unhealed. No retropulsion. Height loss is mild. 4. Aortic Atherosclerosis (ICD10-I70.0). Electronically Signed   By: Neva Seat.D.  On: 10/20/2017 10:31   US Carotid Bilateral (at [facility] And Ap Only)  Result Date: 10/31/2017 CLINICAL DATA:  84 year old female with symptoms of cerebrovascular accident EXAM: BILATERAL CAROTID DUPLEX ULTRASOUND TECHNIQUE: Pearline Cables scale imaging, color Doppler and duplex ultrasound were performed of bilateral carotid and vertebral arteries in the neck. COMPARISON:  None. FINDINGS: Criteria: Quantification of carotid stenosis is based on  velocity parameters that correlate the residual internal carotid diameter with NASCET-based stenosis levels, using the diameter of the distal internal carotid lumen as the denominator for stenosis measurement. The following velocity measurements were obtained: RIGHT ICA:  69/13 cm/sec CCA:  24/2 cm/sec SYSTOLIC ICA/CCA RATIO:  1.2 DIASTOLIC ICA/CCA RATIO:  1.5 ECA:  201 cm/sec LEFT ICA:  146/30 cm/sec CCA:  35/36 cm/sec SYSTOLIC ICA/CCA RATIO:  2.2 DIASTOLIC ICA/CCA RATIO:  5.5 ECA:  73 cm/sec RIGHT CAROTID ARTERY: Heterogeneous atherosclerotic plaque in the distal common carotid artery and proximal internal carotid artery. By peak systolic velocity criteria, the estimated stenosis remains less than 50%. RIGHT VERTEBRAL ARTERY:  Patent with normal antegrade flow. LEFT CAROTID ARTERY: Heterogeneous and irregular atherosclerotic plaque throughout the left common carotid artery extending into the proximal internal carotid artery. By peak systolic velocity criteria, the estimated stenosis falls in the 50-69% range. LEFT VERTEBRAL ARTERY:  Patent with normal antegrade flow. IMPRESSION: 1. Moderate (50-69%) stenosis proximal left internal carotid artery secondary to heterogeneous and irregular atherosclerotic plaque. 2. Mild (1-49%) stenosis proximal right internal carotid artery secondary to heterogenous atherosclerotic plaque. 3. Vertebral arteries are patent with antegrade flow. Signed, Criselda Peaches, MD [facility] Radiology Electronically Signed   By: Jacqulynn Cadet M.D.   On: 10/31/2017 14:57   Dg Chest Port 1 View  Result Date: 10/31/2017 CLINICAL DATA:  Atrial fibrillation EXAM: PORTABLE CHEST 1 VIEW COMPARISON:  10/20/2017 FINDINGS: Moderate bilateral pleural effusions, increasing since prior study. Cardiomegaly with bilateral airspace opacities compatible with edema/CHF, worsening. IMPRESSION: Worsening CHF and bilateral effusions. Electronically  Signed   By: Rolm Baptise M.D.   On: 10/31/2017 16:51    Assessment & Plan    84 yo female with history of afib not anticoagulated due to comorbidity and fall history who was admitted after being noted to have right sided gaze preference and left sided neglect c/w with cva. Brain ct was unremarkable for bleed or new change. MRI not done due to patient inability to remain still. Afib rate being treated with digoxin, diltiazem drip Would titrate the diltiazem to keep rate controlled between 90-110 . Would not chronically anticoagulate at present.   Signed, Javier Docker Trea Latner MD 11/02/2017, 8:18 AM  Pager: [phone]

## 2017-11-02 NOTE — Progress Notes (Signed)
Subjective: Patient awake.  Unable to obtain MRI due to desaturation and agitation.    Objective: Current vital signs: BP 124/69   Pulse (!) 121   Temp 99.6 F (37.6 C) (Axillary)   Resp (!) 23   SpO2 95%  Vital signs in last 24 hours: Temp:  [98.3 F (36.8 C)-99.6 F (37.6 C)] 99.6 F (37.6 C) (02/11 0200) Pulse Rate:  [100-138] 121 (02/11 0600) Resp:  [21-31] 23 (02/11 0600) BP: (98-148)/(47-91) 124/69 (02/11 0600) SpO2:  [90 %-95 %] 95 % (02/11 0600)  Intake/Output from previous day: 02/10 0701 - 02/11 0700 In: 360 [I.V.:360] Out: 615 [Urine:615] Intake/Output this shift: No intake/output data recorded. Nutritional status: Diet NPO time specified Fall precautions Aspiration precautions  Neurologic Exam: Mental Status: Dysarthria but able to respond to questioning.  Follows commands. Alert.   Cranial Nerves: II: Does not blink to confrontation from the left. III,IV, VI: ptosis not present, right gaze preference V,VII: L facial droop  VIII: hearing normal bilaterally IX,X: gag reflex reduced XI: unable to test XII: midline tongue extension Motor: Generalized weakness throughout.  Patient able to squeeze my hand bilaterally.  Does not lift either leg off the bed but moves right greater than left.     Lab Results: Basic Metabolic Panel: Recent Labs  Lab 10/31/17 1036 11/01/17 0524 11/02/17 0516  NA 136 139 137  K 3.7 3.8 3.2*  CL 99* 101 99*  CO2 23 22 24   GLUCOSE 147* 139* 135*  BUN 29* 29* 29*  CREATININE 1.53* 1.32* 1.33*  CALCIUM 9.6 10.0 9.8  MG  --  1.5* 2.2  PHOS  --  3.4 3.0    Liver Function Tests: Recent Labs  Lab 10/31/17 1036  AST 46*  ALT 49  ALKPHOS 135*  BILITOT 1.0  PROT 6.9  ALBUMIN 3.5   No results for input(s): LIPASE, AMYLASE in the last 168 hours. No results for input(s): AMMONIA in the last 168 hours.  CBC: Recent Labs  Lab 10/31/17 1036 11/01/17 0524  WBC 13.0* 15.3*  HGB 12.3 12.4  HCT 37.9 37.9  MCV 86.0  85.9  PLT 328 333    Cardiac Enzymes: Recent Labs  Lab 10/31/17 1036 10/31/17 2007  TROPONINI <0.03 <0.03    Lipid Panel: Recent Labs  Lab 10/31/17 2007  CHOL 129  TRIG 74  HDL 39*  CHOLHDL 3.3  VLDL 15  LDLCALC 75    CBG: Recent Labs  Lab 10/31/17 1826  GLUCAP 135*    Microbiology: Results for orders placed or performed during the hospital encounter of 10/31/17  MRSA PCR Screening     Status: None   Collection Time: 11/01/17 12:05 AM  Result Value Ref Range Status   MRSA by PCR NEGATIVE NEGATIVE Final    Comment:        The GeneXpert MRSA Assay (FDA approved for NASAL specimens only), is one component of a comprehensive MRSA colonization surveillance program. It is not intended to diagnose MRSA infection nor to guide or monitor treatment for MRSA infections. Performed at [facility], [address] [place], [place] [address]     Coagulation Studies: Recent Labs    10/31/17 1036 10/31/17 2007  LABPROT 13.2 13.4  INR 1.01 1.03    Imaging: Ct Head Wo Contrast  Result Date: 10/31/2017 CLINICAL DATA:  Altered mental status. EXAM: CT HEAD WITHOUT CONTRAST TECHNIQUE: Contiguous axial images were obtained from the base of the skull through the vertex without intravenous contrast. COMPARISON:  10/20/2017 FINDINGS: Brain: Old right parietal infarct again noted, unchanged. There is atrophy and chronic small vessel disease changes. No acute intracranial abnormality. Specifically, no hemorrhage, hydrocephalus, mass lesion, acute infarction, or significant intracranial injury. Vascular: No hyperdense vessel or unexpected calcification. Skull: No acute calvarial abnormality. Sinuses/Orbits: Visualized paranasal sinuses and mastoids clear. Orbital soft tissues unremarkable. Other: None IMPRESSION: No acute intracranial abnormality. Atrophy, chronic microvascular disease. Old right parietal infarct. Electronically Signed   By: Rolm Baptise M.D.   On:  10/31/2017 11:03   US Carotid Bilateral (at [facility] And Ap Only)  Result Date: 10/31/2017 CLINICAL DATA:  84 year old female with symptoms of cerebrovascular accident EXAM: BILATERAL CAROTID DUPLEX ULTRASOUND TECHNIQUE: Pearline Cables scale imaging, color Doppler and duplex ultrasound were performed of bilateral carotid and vertebral arteries in the neck. COMPARISON:  None. FINDINGS: Criteria: Quantification of carotid stenosis is based on velocity parameters that correlate the residual internal carotid diameter with NASCET-based stenosis levels, using the diameter of the distal internal carotid lumen as the denominator for stenosis measurement. The following velocity measurements were obtained: RIGHT ICA:  69/13 cm/sec CCA:  71/2 cm/sec SYSTOLIC ICA/CCA RATIO:  1.2 DIASTOLIC ICA/CCA RATIO:  1.5 ECA:  201 cm/sec LEFT ICA:  146/30 cm/sec CCA:  45/80 cm/sec SYSTOLIC ICA/CCA RATIO:  2.2 DIASTOLIC ICA/CCA RATIO:  5.5 ECA:  73 cm/sec RIGHT CAROTID ARTERY: Heterogeneous atherosclerotic plaque in the distal common carotid artery and proximal internal carotid artery. By peak systolic velocity criteria, the estimated stenosis remains less than 50%. RIGHT VERTEBRAL ARTERY:  Patent with normal antegrade flow. LEFT CAROTID ARTERY: Heterogeneous and irregular atherosclerotic plaque throughout the left common carotid artery extending into the proximal internal carotid artery. By peak systolic velocity criteria, the estimated stenosis falls in the 50-69% range. LEFT VERTEBRAL ARTERY:  Patent with normal antegrade flow. IMPRESSION: 1. Moderate (50-69%) stenosis proximal left internal carotid artery secondary to heterogeneous and irregular atherosclerotic plaque. 2. Mild (1-49%) stenosis proximal right internal carotid artery secondary to heterogenous atherosclerotic plaque. 3. Vertebral arteries are patent with antegrade flow. Signed, Criselda Peaches, MD [facility] Radiology  Electronically Signed   By: Jacqulynn Cadet M.D.   On: 10/31/2017 14:57   Dg Chest Port 1 View  Result Date: 10/31/2017 CLINICAL DATA:  Atrial fibrillation EXAM: PORTABLE CHEST 1 VIEW COMPARISON:  10/20/2017 FINDINGS: Moderate bilateral pleural effusions, increasing since prior study. Cardiomegaly with bilateral airspace opacities compatible with edema/CHF, worsening. IMPRESSION: Worsening CHF and bilateral effusions. Electronically Signed   By: Rolm Baptise M.D.   On: 10/31/2017 16:51    Medications:  I have reviewed the patient's current medications. Scheduled: .  stroke: mapping our early stages of recovery book   Does not apply Once  . chlorhexidine  15 mL Mouth Rinse BID  . enoxaparin (LOVENOX) injection  30 mg Subcutaneous Q24H  . mouth rinse  15 mL Mouth Rinse q12n4p    Assessment/Plan: Patient unable to tolerate MRI.  Acute infarct suspected of embolic etiology.  Patient with history of afib on ASA due to falls at home.  Carotid dopplers show no evidence of hemodynamically significant stenosis on the right, 50-59% stenosis on the left ICA.  Echocardiogram shows no cardiac source of emboli with an EF of 55-65%.  A1c 6.2, LDL 75.  Recommendations: 1.  Rectal ASA daily 2.  Would re-attempt MRI but not sedate.  If still unable to perform would repeat head CT without contrast.  CTA not indicated since patient not an intervention candidate (outside  window, noncompliant) 3.  PT/OT and speech consults 4.  Frequent neuro checks 5.  Patient unable to take po for statin continuation at this time.     LOS: 2 days   Alexis Goodell, MD Neurology [phone] 11/02/2017  10:51 AM

## 2017-11-02 NOTE — Progress Notes (Signed)
PT Cancellation Note  Patient Details Name: Emily Moyer MRN: [id number] DOB: 01-17-1924   Cancelled Treatment:    Reason Eval/Treat Not Completed: Medical issues which prohibited therapy(Order received, chart reviewed. HR continues to be elevated (last 128bpm), on cardizem drip. Spoke with RN. Pt confused, not appropriate for therapy this date. Will re-attempt OT evaluation next date as pt is medically appropriate. )  Aleyda Gindlesperger H. Owens Shark, PT, DPT, NCS 11/02/17, 1:42 PM [phone]

## 2017-11-02 NOTE — Progress Notes (Signed)
Patient Name: Emily Moyer Date of Encounter: 11/02/2017  Hospital Problem List     Active Problems:   CVA (cerebral vascular accident) [facility])    Patient Profile     84 yo female with history of afib now with cva.  Subjective   nonresponsive  Inpatient Medications    .  stroke: mapping our early stages of recovery book   Does not apply Once  . chlorhexidine  15 mL Mouth Rinse BID  . enoxaparin (LOVENOX) injection  30 mg Subcutaneous Q24H  . mouth rinse  15 mL Mouth Rinse q12n4p    Vital Signs    Vitals:   11/02/17 1300 11/02/17 1400 11/02/17 1500 11/02/17 1600  BP: 124/72 130/77 126/66 127/71  Pulse: (!) 125 (!) 129 (!) 130 (!) 127  Resp: (!) 24 20 (!) 23 (!) 22  Temp:   98.9 F (37.2 C)   TempSrc:   Axillary   SpO2: 94% 95% 91% 96%    Intake/Output Summary (Last 24 hours) at 11/02/2017 1651 Last data filed at 11/02/2017 1600 Gross per 24 hour  Intake 360 ml  Output 755 ml  Net -395 ml   There were no vitals filed for this visit.  Physical Exam    GEN: Well nourished, well developed, in no acute distress.  HEENT: normal.  Neck: Supple, no JVD, carotid bruits, or masses. Cardiac: RRR, no murmurs, rubs, or gallops. No clubbing, cyanosis, edema.  Radials/DP/PT 2+ and equal bilaterally.  Respiratory:  Respirations regular and unlabored, clear to auscultation bilaterally. GI: Soft, nontender, nondistended, BS + x 4. MS: no deformity or atrophy. Skin: warm and dry, no rash. Neuro:  Strength and sensation are intact. Psych: Normal affect.  Labs    CBC Recent Labs    10/31/17 1036 11/01/17 0524  WBC 13.0* 15.3*  HGB 12.3 12.4  HCT 37.9 37.9  MCV 86.0 85.9  PLT 328 665   Basic Metabolic Panel Recent Labs    11/01/17 0524 11/02/17 0516  NA 139 137  K 3.8 3.2*  CL 101 99*  CO2 22 24  GLUCOSE 139* 135*  BUN 29* 29*  CREATININE 1.32* 1.33*  CALCIUM 10.0 9.8  MG 1.5* 2.2  PHOS 3.4 3.0   Liver Function Tests Recent Labs     10/31/17 1036  AST 46*  ALT 49  ALKPHOS 135*  BILITOT 1.0  PROT 6.9  ALBUMIN 3.5   No results for input(s): LIPASE, AMYLASE in the last 72 hours. Cardiac Enzymes Recent Labs    10/31/17 1036 10/31/17 2007  TROPONINI <0.03 <0.03   BNP No results for input(s): BNP in the last 72 hours. D-Dimer No results for input(s): DDIMER in the last 72 hours. Hemoglobin A1C Recent Labs    10/31/17 1036  HGBA1C 6.2*   Fasting Lipid Panel Recent Labs    10/31/17 2007  CHOL 129  HDL 39*  LDLCALC 75  TRIG 74  CHOLHDL 3.3   Thyroid Function Tests No results for input(s): TSH, T4TOTAL, T3FREE, THYROIDAB in the last 72 hours.  Invalid input(s): FREET3  Telemetry    afib with variable vr  ECG    afib  Radiology    Ct Angio Head W Or Wo Contrast  Result Date: 11/02/2017 CLINICAL DATA:  Stroke follow-up EXAM: CT ANGIOGRAPHY HEAD AND NECK TECHNIQUE: Multidetector CT imaging of the head and neck was performed using the standard protocol during bolus administration of intravenous contrast. Multiplanar CT image reconstructions and MIPs were obtained to evaluate  the vascular anatomy. Carotid stenosis measurements (when applicable) are obtained utilizing NASCET criteria, using the distal internal carotid diameter as the denominator. CONTRAST:  56mL ISOVUE-370 IOPAMIDOL (ISOVUE-370) INJECTION 76% COMPARISON:  Noncontrast head CT from 2 days ago FINDINGS: CT HEAD FINDINGS Brain: There is a moderate to large right lateral frontal infarct with cytotoxic edema pattern. Superficially there is some high density suggesting hemorrhage. Residual visible cortex could contribute to this appearance. There is a remote right parietal cortex infarct that is moderate size. Remote perforator infarct in the left basal ganglia. No hydrocephalus. Vascular: See below Skull: Negative Sinuses: Negative Orbits: Senescent globe calcifications.  No acute finding. Review of the MIP images confirms the above findings CTA  NECK FINDINGS Aortic arch: Diffuse atherosclerotic plaque. No dilatation or dissection seen. Three vessel branching. Right carotid system: Atheromatous plaque at the common carotid bifurcation. No flow limiting stenosis or ulceration. Negative for beading or dissection. Left carotid system: Moderate atheromatous plaque at the common carotid bifurcation and ICA bulb. There is milder plaque along the common carotid. Proximal left ICA stenosis measures 60% on coronal reformats. No dissection or ulceration. Vertebral arteries: No proximal subclavian flow limiting stenosis. Codominant vertebral arteries. Mild atherosclerotic plaque at the right vertebral origin. Both vertebral arteries are diffusely patent and smooth. Skeleton: Usual degenerative changes. Osteopenia. No acute or aggressive finding. Other neck: 23 mm nodule in the left thyroid. 17 mm nodule in the right thyroid. These are likely incidental given patient's condition. The larger is peripherally calcified. Upper chest: Layering pleural effusions reaching the apex, also seen 10/20/2017 by CT. Review of the MIP images confirms the above findings CTA HEAD FINDINGS Anterior circulation: Atherosclerotic plaque on both carotid siphons. The left MCA is occluded at the origin with faint downstream reconstitution. On the right side that shows acute infarct there is no proximal stenosis or occlusion. Posterior circulation: Codominant vertebral arteries. Vertebrobasilar arteries are smooth and diffusely patent. Symmetric and good flow in bilateral posterior cerebral arteries. Venous sinuses: Patent Anatomic variants: None unusual Delayed phase: No abnormal intracranial enhancement. These results were called by telephone at the time of interpretation on 11/02/2017 at 1:27 pm to Dr. Loney Hering , who verbally acknowledged these results. Review of the MIP images confirms the above findings IMPRESSION: 1. Moderate to large right frontal acute infarct. There is likely  small volume superficial/petechial hemorrhage.No flow limiting stenosis or embolic source seen in the right anterior circulation. 2. Left MCA origin occlusion with limited branch opacification in the arterial phase. No visible left hemispheric infarct, this is an age indeterminate finding. 3. 60% atheromatous left ICA bulb narrowing. 4. Remote right parietal and left lateral lenticulostriate infarcts. 5. Layering pleural effusions also seen 10/20/2017. Electronically Signed   By: Monte Fantasia M.D.   On: 11/02/2017 13:31   Dg Chest 1 View  Result Date: 10/20/2017 CLINICAL DATA:  84 year old female with nausea and vomiting. Breast cancer post lumpectomy. Hypertension. Nonsmoker. Initial encounter. EXAM: CHEST 1 VIEW COMPARISON:  04/12/2012. FINDINGS: Pulmonary edema bilateral pleural effusions greater on right. Basal atelectasis suspected. Heart size top-normal. Calcified aorta. No acute osseous abnormality. IMPRESSION: Pulmonary edema with bilateral pleural effusions greater on right. Basilar subsegmental atelectasis. Aortic Atherosclerosis (ICD10-I70.0). Electronically Signed   By: Genia Del M.D.   On: 10/20/2017 07:58   Ct Head Wo Contrast  Result Date: 10/31/2017 CLINICAL DATA:  Altered mental status. EXAM: CT HEAD WITHOUT CONTRAST TECHNIQUE: Contiguous axial images were obtained from the base of the skull through the vertex without intravenous  contrast. COMPARISON:  10/20/2017 FINDINGS: Brain: Old right parietal infarct again noted, unchanged. There is atrophy and chronic small vessel disease changes. No acute intracranial abnormality. Specifically, no hemorrhage, hydrocephalus, mass lesion, acute infarction, or significant intracranial injury. Vascular: No hyperdense vessel or unexpected calcification. Skull: No acute calvarial abnormality. Sinuses/Orbits: Visualized paranasal sinuses and mastoids clear. Orbital soft tissues unremarkable. Other: None IMPRESSION: No acute intracranial abnormality.  Atrophy, chronic microvascular disease. Old right parietal infarct. Electronically Signed   By: Rolm Baptise M.D.   On: 10/31/2017 11:03   Ct Head Wo Contrast  Result Date: 10/20/2017 CLINICAL DATA:  84 year old female with sudden onset of nausea vomiting and weakness. Hypertension. Prior infarct. Initial encounter. EXAM: CT HEAD WITHOUT CONTRAST TECHNIQUE: Contiguous axial images were obtained from the base of the skull through the vertex without intravenous contrast. COMPARISON:  None. FINDINGS: Brain: No intracranial hemorrhage or CT evidence of large acute infarct. Remote posterior right frontal-parietal lobe infarct. Remote left lenticular nucleus/caudate infarct. Moderate chronic microvascular changes. Mild global atrophy. No intracranial mass lesion noted on this unenhanced exam. Prominent dural calcifications greater on left. Vascular: Vascular calcifications Skull: No skull fracture Sinuses/Orbits: Visualized orbital structures and paranasal sinuses unremarkable. Other: Mastoid air cells and middle ear cavities are clear. IMPRESSION: No acute intracranial abnormality. Remote posterior right frontal-parietal lobe infarct. Remote left lenticular nucleus/caudate infarct. Moderate chronic microvascular changes. Electronically Signed   By: Genia Del M.D.   On: 10/20/2017 08:47   Ct Angio Neck W Or Wo Contrast  Result Date: 11/02/2017 CLINICAL DATA:  Stroke follow-up EXAM: CT ANGIOGRAPHY HEAD AND NECK TECHNIQUE: Multidetector CT imaging of the head and neck was performed using the standard protocol during bolus administration of intravenous contrast. Multiplanar CT image reconstructions and MIPs were obtained to evaluate the vascular anatomy. Carotid stenosis measurements (when applicable) are obtained utilizing NASCET criteria, using the distal internal carotid diameter as the denominator. CONTRAST:  25mL ISOVUE-370 IOPAMIDOL (ISOVUE-370) INJECTION 76% COMPARISON:  Noncontrast head CT from 2 days ago  FINDINGS: CT HEAD FINDINGS Brain: There is a moderate to large right lateral frontal infarct with cytotoxic edema pattern. Superficially there is some high density suggesting hemorrhage. Residual visible cortex could contribute to this appearance. There is a remote right parietal cortex infarct that is moderate size. Remote perforator infarct in the left basal ganglia. No hydrocephalus. Vascular: See below Skull: Negative Sinuses: Negative Orbits: Senescent globe calcifications.  No acute finding. Review of the MIP images confirms the above findings CTA NECK FINDINGS Aortic arch: Diffuse atherosclerotic plaque. No dilatation or dissection seen. Three vessel branching. Right carotid system: Atheromatous plaque at the common carotid bifurcation. No flow limiting stenosis or ulceration. Negative for beading or dissection. Left carotid system: Moderate atheromatous plaque at the common carotid bifurcation and ICA bulb. There is milder plaque along the common carotid. Proximal left ICA stenosis measures 60% on coronal reformats. No dissection or ulceration. Vertebral arteries: No proximal subclavian flow limiting stenosis. Codominant vertebral arteries. Mild atherosclerotic plaque at the right vertebral origin. Both vertebral arteries are diffusely patent and smooth. Skeleton: Usual degenerative changes. Osteopenia. No acute or aggressive finding. Other neck: 23 mm nodule in the left thyroid. 17 mm nodule in the right thyroid. These are likely incidental given patient's condition. The larger is peripherally calcified. Upper chest: Layering pleural effusions reaching the apex, also seen 10/20/2017 by CT. Review of the MIP images confirms the above findings CTA HEAD FINDINGS Anterior circulation: Atherosclerotic plaque on both carotid siphons. The left MCA is occluded  at the origin with faint downstream reconstitution. On the right side that shows acute infarct there is no proximal stenosis or occlusion. Posterior  circulation: Codominant vertebral arteries. Vertebrobasilar arteries are smooth and diffusely patent. Symmetric and good flow in bilateral posterior cerebral arteries. Venous sinuses: Patent Anatomic variants: None unusual Delayed phase: No abnormal intracranial enhancement. These results were called by telephone at the time of interpretation on 11/02/2017 at 1:27 pm to Dr. Loney Hering , who verbally acknowledged these results. Review of the MIP images confirms the above findings IMPRESSION: 1. Moderate to large right frontal acute infarct. There is likely small volume superficial/petechial hemorrhage.No flow limiting stenosis or embolic source seen in the right anterior circulation. 2. Left MCA origin occlusion with limited branch opacification in the arterial phase. No visible left hemispheric infarct, this is an age indeterminate finding. 3. 60% atheromatous left ICA bulb narrowing. 4. Remote right parietal and left lateral lenticulostriate infarcts. 5. Layering pleural effusions also seen 10/20/2017. Electronically Signed   By: Monte Fantasia M.D.   On: 11/02/2017 13:31   Ct Chest Wo Contrast  Result Date: 10/20/2017 CLINICAL DATA:  Acute respiratory illness with pleural effusion. EXAM: CT CHEST WITHOUT CONTRAST TECHNIQUE: Multidetector CT imaging of the chest was performed following the standard protocol without IV contrast. COMPARISON:  Chest x-ray from earlier today FINDINGS: Cardiovascular: Biatrial enlargement. No pericardial effusion. Atherosclerotic calcification of the aorta and coronaries. Large main pulmonary artery as seen with pulmonary hypertension, 3.4 cm. Mediastinum/Nodes: Negative for adenopathy. There is a peripherally calcified nodule posteriorly from the left lobe thyroid measuring 2.2 cm. No associated adenopathy. Lungs/Pleura: Small left and more moderate right pleural effusions that are layering. Diffuse interlobular septal thickening and bronchial wall thickening. Atelectasis in  the lower lobes with ground-glass density that could be alveolar edema or incomplete atelectasis. There is a focal ill-defined 2.1 cm dense opacity in the left upper lobe which is different than the other findings. Small subpleural pulmonary nodules bilaterally which will be seen at follow-up. Upper Abdomen: No acute finding. Musculoskeletal: L1 superior endplate fracture with sclerosis and horizontal fracture plane beneath the mildly depressed superior endplate. This is likely not acute but does not appear healed. No retropulsion. IMPRESSION: 1. Moderate right and small left layering pleural effusions with pulmonary edema and lower lobe atelectasis. Biatrial enlargement. 2. 3 cm spiculated opacity in the superior segment left lower lobe which could be incidental neoplasm or pneumonia. Recommend follow-up noncontrast chest CT in 2 to 3 months. 3. L1 compression fracture that is likely subacute and unhealed. No retropulsion. Height loss is mild. 4. Aortic Atherosclerosis (ICD10-I70.0). Electronically Signed   By: Monte Fantasia M.D.   On: 10/20/2017 10:31   US Carotid Bilateral (at [facility] Only)  Result Date: 10/31/2017 CLINICAL DATA:  84 year old female with symptoms of cerebrovascular accident EXAM: BILATERAL CAROTID DUPLEX ULTRASOUND TECHNIQUE: Pearline Cables scale imaging, color Doppler and duplex ultrasound were performed of bilateral carotid and vertebral arteries in the neck. COMPARISON:  None. FINDINGS: Criteria: Quantification of carotid stenosis is based on velocity parameters that correlate the residual internal carotid diameter with NASCET-based stenosis levels, using the diameter of the distal internal carotid lumen as the denominator for stenosis measurement. The following velocity measurements were obtained: RIGHT ICA:  69/13 cm/sec CCA:  34/1 cm/sec SYSTOLIC ICA/CCA RATIO:  1.2 DIASTOLIC ICA/CCA RATIO:  1.5 ECA:  201 cm/sec LEFT ICA:  146/30 cm/sec CCA:  93/79 cm/sec SYSTOLIC ICA/CCA RATIO:  2.2  DIASTOLIC ICA/CCA RATIO:  5.5 ECA:  73 cm/sec RIGHT CAROTID ARTERY: Heterogeneous atherosclerotic plaque in the distal common carotid artery and proximal internal carotid artery. By peak systolic velocity criteria, the estimated stenosis remains less than 50%. RIGHT VERTEBRAL ARTERY:  Patent with normal antegrade flow. LEFT CAROTID ARTERY: Heterogeneous and irregular atherosclerotic plaque throughout the left common carotid artery extending into the proximal internal carotid artery. By peak systolic velocity criteria, the estimated stenosis falls in the 50-69% range. LEFT VERTEBRAL ARTERY:  Patent with normal antegrade flow. IMPRESSION: 1. Moderate (50-69%) stenosis proximal left internal carotid artery secondary to heterogeneous and irregular atherosclerotic plaque. 2. Mild (1-49%) stenosis proximal right internal carotid artery secondary to heterogenous atherosclerotic plaque. 3. Vertebral arteries are patent with antegrade flow. Signed, Criselda Peaches, MD [facility] Electronically Signed   By: Jacqulynn Cadet M.D.   On: 10/31/2017 14:57   Dg Chest Port 1 View  Result Date: 10/31/2017 CLINICAL DATA:  Atrial fibrillation EXAM: PORTABLE CHEST 1 VIEW COMPARISON:  10/20/2017 FINDINGS: Moderate bilateral pleural effusions, increasing since prior study. Cardiomegaly with bilateral airspace opacities compatible with edema/CHF, worsening. IMPRESSION: Worsening CHF and bilateral effusions. Electronically Signed   By: Rolm Baptise M.D.   On: 10/31/2017 16:51    Assessment & Plan    Pt with afib not anticoagulated due to fall history now with large left MCA cva. Not candidate for chronic anticoagulation. Rectal asa. Cardizem drip for rate control as patient cannot take po. Poo prognosis.   Signed, Javier Docker Takeysha Bonk MD 11/02/2017, 4:51 PM  Pager: [phone]

## 2017-11-02 NOTE — Plan of Care (Signed)
Patient continues to be confused, alert to self only, right sided deviation and garbled speech. Hand mitts are on patient for safety of lines and drains. Pt uses snuff, oral cavity is stained dark brown. Pt has been coughing up copius amounts of sputum and has been requiring suctioning with yankar, tolerating well. Pt continues to be afib on cardiac monitor, HR 100-120's. Cardizem drip continues at 15mg /hr. SpO2 94%  on 4 liters [place]. Foley catheter drained 375 ml of yellow urine this shift. Pt requires frequent repositioning. High fall risk. Spoke with patient's daughter, [name]. She has requested that the rounding MD call her today at work at [phone]. Will continue to monitor patient closely.

## 2017-11-02 NOTE — Progress Notes (Addendum)
Pharmacy Electrolyte Monitoring Consult:  Pharmacy consulted to assist in monitoring and replacing electrolytes in this 84 y.o. female admitted on 10/31/2017 with Altered Mental Status   Labs:  Sodium (mmol/L)  Date Value  11/02/2017 137   Potassium (mmol/L)  Date Value  11/02/2017 3.2 (L)   Magnesium (mg/dL)  Date Value  11/02/2017 2.2   Phosphorus (mg/dL)  Date Value  11/02/2017 3.0   Calcium (mg/dL)  Date Value  11/02/2017 9.8   Albumin (g/dL)  Date Value  10/31/2017 3.5    Plan: 1. Electrolytes: Replaced potassium 66mEq IV Q1hr x 4 hours. Will recheck electrolytes with am labs.   2. Constipation Management: Patient has not had a BM this admission, however patient has been NPO. Will continue to monitor per consult.   Pharmacy will continue to monitor and adjust per consult.   Pernell Dupre, PharmD, BCPS Clinical Pharmacist 11/03/2017 8:41 AM

## 2017-11-02 NOTE — Progress Notes (Signed)
[facility] NAME: Emily Moyer    MR#:  [id number]  DATE OF BIRTH:  [birth year]  SUBJECTIVE:  CHIEF COMPLAINT:   Chief Complaint  Patient presents with  . Altered Mental Status  Patient continues to be encephalopathic, not following commands, localizes to pain, right-sided gaze  REVIEW OF SYSTEMS:  CONSTITUTIONAL: No fever, fatigue or weakness.  EYES: No blurred or double vision.  EARS, NOSE, AND THROAT: No tinnitus or ear pain.  RESPIRATORY: No cough, shortness of breath, wheezing or hemoptysis.  CARDIOVASCULAR: No chest pain, orthopnea, edema.  GASTROINTESTINAL: No nausea, vomiting, diarrhea or abdominal pain.  GENITOURINARY: No dysuria, hematuria.  ENDOCRINE: No polyuria, nocturia,  HEMATOLOGY: No anemia, easy bruising or bleeding SKIN: No rash or lesion. MUSCULOSKELETAL: No joint pain or arthritis.   NEUROLOGIC: No tingling, numbness, weakness.  PSYCHIATRY: No anxiety or depression.   ROS  DRUG ALLERGIES:   Allergies  Allergen Reactions  . Aspirin-Dipyridamole Er     Other reaction(s): Unknown  . Dipyridamole     Other reaction(s): Unknown  . Lyrica [Pregabalin] Swelling  . Clonidine Other (See Comments)    Dry mouth  . Cymbalta  [Duloxetine Hcl]     Other reaction(s): Other (See Comments) insomnia  . Duloxetine     Other reaction(s): Other (See Comments) insomnia    VITALS:  Blood pressure 113/72, pulse (!) 123, temperature 99.1 F (37.3 C), temperature source Axillary, resp. rate 19, SpO2 93 %.  PHYSICAL EXAMINATION:  GENERAL:  84 y.o.-year-old patient lying in the bed with no acute distress.  EYES: Pupils equal, round, reactive to light and accommodation. No scleral icterus. Extraocular muscles intact.  HEENT: Head atraumatic, normocephalic. Oropharynx and nasopharynx clear.  NECK:  Supple, no jugular venous distention. No thyroid enlargement, no tenderness.  LUNGS: Normal breath sounds  bilaterally, no wheezing, rales,rhonchi or crepitation. No use of accessory muscles of respiration.  CARDIOVASCULAR: S1, S2 normal. No murmurs, rubs, or gallops.  ABDOMEN: Soft, nontender, nondistended. Bowel sounds present. No organomegaly or mass.  EXTREMITIES: No pedal edema, cyanosis, or clubbing.  NEUROLOGIC: Cranial nerves II through XII are intact. Muscle strength 5/5 in all extremities. Sensation intact. Gait not checked.  PSYCHIATRIC: The patient is alert and oriented x 3.  SKIN: No obvious rash, lesion, or ulcer.   Physical Exam LABORATORY PANEL:   CBC Recent Labs  Lab 11/01/17 0524  WBC 15.3*  HGB 12.4  HCT 37.9  PLT 333   ------------------------------------------------------------------------------------------------------------------  Chemistries  Recent Labs  Lab 10/31/17 1036  11/02/17 0516  NA 136   < > 137  K 3.7   < > 3.2*  CL 99*   < > 99*  CO2 23   < > 24  GLUCOSE 147*   < > 135*  BUN 29*   < > 29*  CREATININE 1.53*   < > 1.33*  CALCIUM 9.6   < > 9.8  MG  --    < > 2.2  AST 46*  --   --   ALT 49  --   --   ALKPHOS 135*  --   --   BILITOT 1.0  --   --    < > = values in this interval not displayed.   ------------------------------------------------------------------------------------------------------------------  Cardiac Enzymes Recent Labs  Lab 10/31/17 1036 10/31/17 2007  TROPONINI <0.03 <0.03   ------------------------------------------------------------------------------------------------------------------  RADIOLOGY:  Ct Angio Head W Or Wo Contrast  Result Date: 11/02/2017 CLINICAL DATA:  Stroke follow-up EXAM: CT ANGIOGRAPHY HEAD AND NECK TECHNIQUE: Multidetector CT imaging of the head and neck was performed using the standard protocol during bolus administration of intravenous contrast. Multiplanar CT image reconstructions and MIPs were obtained to evaluate the vascular anatomy. Carotid stenosis measurements (when applicable) are  obtained utilizing NASCET criteria, using the distal internal carotid diameter as the denominator. CONTRAST:  74mL ISOVUE-370 IOPAMIDOL (ISOVUE-370) INJECTION 76% COMPARISON:  Noncontrast head CT from 2 days ago FINDINGS: CT HEAD FINDINGS Brain: There is a moderate to large right lateral frontal infarct with cytotoxic edema pattern. Superficially there is some high density suggesting hemorrhage. Residual visible cortex could contribute to this appearance. There is a remote right parietal cortex infarct that is moderate size. Remote perforator infarct in the left basal ganglia. No hydrocephalus. Vascular: See below Skull: Negative Sinuses: Negative Orbits: Senescent globe calcifications.  No acute finding. Review of the MIP images confirms the above findings CTA NECK FINDINGS Aortic arch: Diffuse atherosclerotic plaque. No dilatation or dissection seen. Three vessel branching. Right carotid system: Atheromatous plaque at the common carotid bifurcation. No flow limiting stenosis or ulceration. Negative for beading or dissection. Left carotid system: Moderate atheromatous plaque at the common carotid bifurcation and ICA bulb. There is milder plaque along the common carotid. Proximal left ICA stenosis measures 60% on coronal reformats. No dissection or ulceration. Vertebral arteries: No proximal subclavian flow limiting stenosis. Codominant vertebral arteries. Mild atherosclerotic plaque at the right vertebral origin. Both vertebral arteries are diffusely patent and smooth. Skeleton: Usual degenerative changes. Osteopenia. No acute or aggressive finding. Other neck: 23 mm nodule in the left thyroid. 17 mm nodule in the right thyroid. These are likely incidental given patient's condition. The larger is peripherally calcified. Upper chest: Layering pleural effusions reaching the apex, also seen 10/20/2017 by CT. Review of the MIP images confirms the above findings CTA HEAD FINDINGS Anterior circulation: Atherosclerotic  plaque on both carotid siphons. The left MCA is occluded at the origin with faint downstream reconstitution. On the right side that shows acute infarct there is no proximal stenosis or occlusion. Posterior circulation: Codominant vertebral arteries. Vertebrobasilar arteries are smooth and diffusely patent. Symmetric and good flow in bilateral posterior cerebral arteries. Venous sinuses: Patent Anatomic variants: None unusual Delayed phase: No abnormal intracranial enhancement. These results were called by telephone at the time of interpretation on 11/02/2017 at 1:27 pm to Dr. Loney Hering , who verbally acknowledged these results. Review of the MIP images confirms the above findings IMPRESSION: 1. Moderate to large right frontal acute infarct. There is likely small volume superficial/petechial hemorrhage.No flow limiting stenosis or embolic source seen in the right anterior circulation. 2. Left MCA origin occlusion with limited branch opacification in the arterial phase. No visible left hemispheric infarct, this is an age indeterminate finding. 3. 60% atheromatous left ICA bulb narrowing. 4. Remote right parietal and left lateral lenticulostriate infarcts. 5. Layering pleural effusions also seen 10/20/2017. Electronically Signed   By: Monte Fantasia M.D.   On: 11/02/2017 13:31   Ct Angio Neck W Or Wo Contrast  Result Date: 11/02/2017 CLINICAL DATA:  Stroke follow-up EXAM: CT ANGIOGRAPHY HEAD AND NECK TECHNIQUE: Multidetector CT imaging of the head and neck was performed using the standard protocol during bolus administration of intravenous contrast. Multiplanar CT image reconstructions and MIPs were obtained to evaluate the vascular anatomy. Carotid stenosis measurements (when applicable) are obtained utilizing NASCET criteria, using the distal internal carotid diameter as the denominator. CONTRAST:  27mL ISOVUE-370 IOPAMIDOL (ISOVUE-370) INJECTION  76% COMPARISON:  Noncontrast head CT from 2 days ago FINDINGS:  CT HEAD FINDINGS Brain: There is a moderate to large right lateral frontal infarct with cytotoxic edema pattern. Superficially there is some high density suggesting hemorrhage. Residual visible cortex could contribute to this appearance. There is a remote right parietal cortex infarct that is moderate size. Remote perforator infarct in the left basal ganglia. No hydrocephalus. Vascular: See below Skull: Negative Sinuses: Negative Orbits: Senescent globe calcifications.  No acute finding. Review of the MIP images confirms the above findings CTA NECK FINDINGS Aortic arch: Diffuse atherosclerotic plaque. No dilatation or dissection seen. Three vessel branching. Right carotid system: Atheromatous plaque at the common carotid bifurcation. No flow limiting stenosis or ulceration. Negative for beading or dissection. Left carotid system: Moderate atheromatous plaque at the common carotid bifurcation and ICA bulb. There is milder plaque along the common carotid. Proximal left ICA stenosis measures 60% on coronal reformats. No dissection or ulceration. Vertebral arteries: No proximal subclavian flow limiting stenosis. Codominant vertebral arteries. Mild atherosclerotic plaque at the right vertebral origin. Both vertebral arteries are diffusely patent and smooth. Skeleton: Usual degenerative changes. Osteopenia. No acute or aggressive finding. Other neck: 23 mm nodule in the left thyroid. 17 mm nodule in the right thyroid. These are likely incidental given patient's condition. The larger is peripherally calcified. Upper chest: Layering pleural effusions reaching the apex, also seen 10/20/2017 by CT. Review of the MIP images confirms the above findings CTA HEAD FINDINGS Anterior circulation: Atherosclerotic plaque on both carotid siphons. The left MCA is occluded at the origin with faint downstream reconstitution. On the right side that shows acute infarct there is no proximal stenosis or occlusion. Posterior circulation:  Codominant vertebral arteries. Vertebrobasilar arteries are smooth and diffusely patent. Symmetric and good flow in bilateral posterior cerebral arteries. Venous sinuses: Patent Anatomic variants: None unusual Delayed phase: No abnormal intracranial enhancement. These results were called by telephone at the time of interpretation on 11/02/2017 at 1:27 pm to Dr. Loney Hering , who verbally acknowledged these results. Review of the MIP images confirms the above findings IMPRESSION: 1. Moderate to large right frontal acute infarct. There is likely small volume superficial/petechial hemorrhage.No flow limiting stenosis or embolic source seen in the right anterior circulation. 2. Left MCA origin occlusion with limited branch opacification in the arterial phase. No visible left hemispheric infarct, this is an age indeterminate finding. 3. 60% atheromatous left ICA bulb narrowing. 4. Remote right parietal and left lateral lenticulostriate infarcts. 5. Layering pleural effusions also seen 10/20/2017. Electronically Signed   By: Monte Fantasia M.D.   On: 11/02/2017 13:31   Dg Chest Port 1 View  Result Date: 10/31/2017 CLINICAL DATA:  Atrial fibrillation EXAM: PORTABLE CHEST 1 VIEW COMPARISON:  10/20/2017 FINDINGS: Moderate bilateral pleural effusions, increasing since prior study. Cardiomegaly with bilateral airspace opacities compatible with edema/CHF, worsening. IMPRESSION: Worsening CHF and bilateral effusions. Electronically Signed   By: Rolm Baptise M.D.   On: 10/31/2017 16:51    ASSESSMENT AND PLAN:  Patient is a 84 year old white female with history of atrial fibrillation presenting with strokelike symptoms  1.Acute large right frontal lobe infarct  CT angiogram of the head and neck noted for large right frontal lobe infarct and occlusion of left MCA in discussion with radiology, continue CVA protocol, echocardiogram noted for ejection fraction 55-65%, patient is not a good candidate for  anticoagulation-cardiology agrees, suspect patient will require feeding tube  2.  Acute A. fib with RVR stable Stable Cardiology input  appreciated, patient is a poor candidate for anticoagulation, continue Cardizem drip, did receive digoxin while in house, vitals per routine, make changes as per necessary Echocardiogram noted above  3.Essential hypertension, chronic Stable  4.Medical noncompliance Noted previous admission patient left AMA DOS involved, psychiatry did see patient while in house - pt is non-verbal/refusing to take meds  5.  Chronic noncompliance of medical management Prognosis dismal  DNR status Prognosis dismal Disposition to nursing home if family in agreement   All the records are reviewed and case discussed with Care Management/Social Workerr. Management plans discussed with the patient, family and they are in agreement.  CODE STATUS: dnr  TOTAL TIME TAKING CARE OF THIS PATIENT: 35 minutes.     POSSIBLE D/C IN 2-5 DAYS, DEPENDING ON CLINICAL CONDITION.   Avel Peace Donnelle Olmeda M.D on 11/02/2017   Between 7am to 6pm - Pager - [phone]  After 6pm go to [email or web address] - password [email or web address] [facility]  Office  [phone]  CC: Primary care physician; Ezequiel Kayser, MD  Note: This dictation was prepared with Dragon dictation along with smaller phrase technology. Any transcriptional errors that result from this process are unintentional.

## 2017-11-03 LAB — BASIC METABOLIC PANEL
ANION GAP: 13 (ref 5–15)
BUN: 29 mg/dL — ABNORMAL HIGH (ref 6–20)
CHLORIDE: 102 mmol/L (ref 101–111)
CO2: 23 mmol/L (ref 22–32)
Calcium: 9.8 mg/dL (ref 8.9–10.3)
Creatinine, Ser: 1.04 mg/dL — ABNORMAL HIGH (ref 0.44–1.00)
GFR calc Af Amer: 53 mL/min — ABNORMAL LOW (ref >60)
GFR, EST NON AFRICAN AMERICAN: 46 mL/min — AB (ref >60)
GLUCOSE: 134 mg/dL — AB (ref 65–99)
POTASSIUM: 3.4 mmol/L — AB (ref 3.5–5.1)
Sodium: 138 mmol/L (ref 135–145)

## 2017-11-03 LAB — MAGNESIUM: Magnesium: 2 mg/dL (ref 1.7–2.4)

## 2017-11-03 MED ORDER — AMIODARONE HCL IN DEXTROSE 360-4.14 MG/200ML-% IV SOLN
60.0000 mg/h | INTRAVENOUS | Status: DC
  Administered 2017-11-03 (×2): 30 mg/h via INTRAVENOUS
  Administered 2017-11-04: 60 mg/h via INTRAVENOUS
  Administered 2017-11-04: 30 mg/h via INTRAVENOUS
  Administered 2017-11-04 – 2017-11-06 (×8): 60 mg/h via INTRAVENOUS
  Filled 2017-11-03 (×17): qty 200

## 2017-11-03 MED ORDER — AMIODARONE LOAD VIA INFUSION
150.0000 mg | Freq: Once | INTRAVENOUS | Status: AC
  Administered 2017-11-03: 150 mg via INTRAVENOUS
  Filled 2017-11-03: qty 83.34

## 2017-11-03 MED ORDER — POTASSIUM CHLORIDE 10 MEQ/100ML IV SOLN
10.0000 meq | INTRAVENOUS | Status: AC
  Administered 2017-11-03 (×2): 10 meq via INTRAVENOUS
  Filled 2017-11-03 (×2): qty 100

## 2017-11-03 MED ORDER — DIGOXIN 0.25 MG/ML IJ SOLN
0.2500 mg | Freq: Once | INTRAMUSCULAR | Status: AC
  Administered 2017-11-03: 0.25 mg via INTRAVENOUS
  Filled 2017-11-03: qty 1

## 2017-11-03 MED ORDER — ENOXAPARIN SODIUM 40 MG/0.4ML ~~LOC~~ SOLN
40.0000 mg | SUBCUTANEOUS | Status: DC
  Administered 2017-11-03: 40 mg via SUBCUTANEOUS
  Filled 2017-11-03: qty 0.4

## 2017-11-03 MED ORDER — AMIODARONE HCL IN DEXTROSE 360-4.14 MG/200ML-% IV SOLN
60.0000 mg/h | INTRAVENOUS | Status: AC
  Administered 2017-11-03 (×2): 60 mg/h via INTRAVENOUS
  Filled 2017-11-03: qty 400

## 2017-11-03 NOTE — Progress Notes (Signed)
PT Cancellation Note  Patient Details Name: Emily Moyer MRN: [id number] DOB: [birth year]   Cancelled Treatment:    Reason Eval/Treat Not Completed: Medical issues which prohibited therapy(Evaluation re-attempted.  Patient with continued elevation in HR despite current intervention.  Per primary RN, patient with recent amioderone bolus and pending discussion with cardiology for other rate-controlling options.  Consistently fluctuating 120-140s in supine at rest.  Will continue medical hold at this time and re-attempt next date as appropriate.)   Macaila Tahir H. Owens Shark, PT, DPT, NCS 11/03/17, 2:44 PM [phone]

## 2017-11-03 NOTE — Progress Notes (Signed)
Follow up - Critical Care Medicine Note  Patient Details:    Emily Moyer is an 84 y.o. female.  [name] is a 84 year old female with a past medical history remarkable for hypertension, diabetes, neuropathy, CVA, breast carcinoma, atrial fibrillation on aspirin due to falls,receently hospitalized here recently with a UTI and pneumonia who left AGAINST MEDICAL ADVICE recently. She is brought in by her daughter because she was found sitting on the side of her bed with right-sided gaze deviation, slurred speech and left-sided neglect by her daughter.    CC follow up mental status changes  HPI Stuperous, unable to follow commands Patient is DNR/DNI   CT HEAD 1. Moderate to large right frontal acute infarct. There is likely small volume superficial/petechial hemorrhage.No flow limiting stenosis or embolic source seen in the right anterior circulation. 2. Left MCA origin occlusion with limited branch opacification in the arterial phase. No visible left hemispheric infarct, this is an age indeterminate finding. 3. 60% atheromatous left ICA bulb narrowing. 4. Remote right parietal and left lateral lenticulostriate infarcts.     Consults: Treatment Team:  Teodoro Spray, MD Clovis Fredrickson, MD Leotis Pain, MD Clapacs, Madie Reno, MD    Objective:  Vital signs for last 24 hours: Temp:  [98.2 F (36.8 C)-99.1 F (37.3 C)] 98.2 F (36.8 C) (02/12 0200) Pulse Rate:  [104-136] 113 (02/12 0600) Resp:  [17-33] 20 (02/12 0600) BP: (99-138)/(48-104) 117/69 (02/12 0600) SpO2:  [89 %-98 %] 96 % (02/12 0600)    Intake/Output from previous day: 02/11 0701 - 02/12 0700 In: 345 [I.V.:345] Out: 850 [Urine:850]  Intake/Output this shift: No intake/output data recorded.   Review of Systems: Limited due to mental decline   Physical Exam:  Patient stuperous  HEENT: No respiratory distress, trachea is midline, left-sided facial  droop Cardiovascular: Patient with atrial fibrillation, irregularly irregular rhythm, ventricular responses in the low 100s Pulmonary: Scant basilar crackles appreciated right greater than left Abdominal: Positive bowel sounds, soft exam Extremities: No clubbing cyanosis or edema noted Neurologic: Patient with right-sided gaze, left-sided neglect with facial asymmetry does not remove all extremities  Assessment/Plan:  84 yo female with history of afib not anticoagulated due to comorbidity and fall history who was admitted after being noted to have right sided gaze preference and left sided neglect c/w with cva.     CVA  Pending neurology input  Will need to update family and address goals of care  Congestive heart failure. Patient diuresed 1.1 L. Presently on nasal cannula   Rapid atrial fibrillation.  Patient starting on a Cardizem infusion for rate control  Hypertension. Blood pressure stable as morning  Leukocytosis. Leukocytosis of 15.3. No clear evidence of infection noted  Renal insufficiency. BUN/creatinine is 29/1.32 essentially the same. Will follow   Pending neurology input after consultation will consider for transfer   Overall prognosis is poor Recommend palliative care consult and hospice  Transfer to floor   Devita Nies Patricia Pesa, M.D.  [facility]  Medical Director [place] Director [facility] Cardio-Pulmonary Department

## 2017-11-03 NOTE — Progress Notes (Signed)
SLP Cancellation Note  Patient Details Name: Emily Moyer MRN: [id number] DOB: Oct 30, 1923   Cancelled treatment:       Reason Eval/Treat Not Completed: Patient's level of consciousness;Patient not medically ready(chart reviewed; observed pt's behavior in bed). Pt presented as agitated w/ increased body movements in bed; pulling at tubing. Due to pt's increased confusion and decreased alertness, po trials and oral intake is not recommended d/t the increased risk for aspiration at this time. ST services will f/u tomorrow for possible po trials to hopefully establish an oral diet (safely). NSG updated.   Orinda Kenner, MS, CCC-SLP Watson,Katherine 11/03/2017, 6:13 PM

## 2017-11-03 NOTE — Progress Notes (Signed)
Subjective: Patient remains alert.  Somewhat agitated and attempted to get out of the bed.  This is much improved from last evening when the patient was somewhat unresponsive.  Objective: Current vital signs: BP 125/68   Pulse (!) 137   Temp 98 F (36.7 C) (Axillary)   Resp (!) 24   Wt 63.9 kg (140 lb 14 oz)   SpO2 92%   BMI 22.74 kg/m  Vital signs in last 24 hours: Temp:  [98 F (36.7 C)-99.1 F (37.3 C)] 98 F (36.7 C) (02/12 0800) Pulse Rate:  [104-144] 137 (02/12 1100) Resp:  [17-45] 24 (02/12 1100) BP: (99-138)/(48-110) 125/68 (02/12 1100) SpO2:  [89 %-98 %] 92 % (02/12 1100) Weight:  [63.9 kg (140 lb 14 oz)] 63.9 kg (140 lb 14 oz) (02/12 0800)  Intake/Output from previous day: 02/11 0701 - 02/12 0700 In: 345 [I.V.:345] Out: 850 [Urine:850] Intake/Output this shift: Total I/O In: -  Out: 135 [Urine:135] Nutritional status: Diet NPO time specified Fall precautions Aspiration precautions  Neurologic Exam: Mental Status: Dysarthria but able to respond to questioning.  Follows commands. Alert and agitated.   Cranial Nerves: II: Does not blink to confrontation from the left. III,IV, VI: ptosis not present, right gaze preference with patient to go beyond midline to some degree with oculocephalic maneuvers. V,VII: L facial droop VIII: hearing normal bilaterally IX,X: gag reflex reduced XI: unable to test XII: midline tongue extension Motor: Patient able to squeeze my hand bilaterally.    Lift all extremities against gravity.    Lab Results: Basic Metabolic Panel: Recent Labs  Lab 10/31/17 1036 11/01/17 0524 11/02/17 0516 11/03/17 0532  NA 136 139 137 138  K 3.7 3.8 3.2* 3.4*  CL 99* 101 99* 102  CO2 23 22 24 23   GLUCOSE 147* 139* 135* 134*  BUN 29* 29* 29* 29*  CREATININE 1.53* 1.32* 1.33* 1.04*  CALCIUM 9.6 10.0 9.8 9.8  MG  --  1.5* 2.2 2.0  PHOS  --  3.4 3.0  --     Liver Function Tests: Recent Labs  Lab 10/31/17 1036  AST 46*  ALT 49   ALKPHOS 135*  BILITOT 1.0  PROT 6.9  ALBUMIN 3.5   No results for input(s): LIPASE, AMYLASE in the last 168 hours. No results for input(s): AMMONIA in the last 168 hours.  CBC: Recent Labs  Lab 10/31/17 1036 11/01/17 0524  WBC 13.0* 15.3*  HGB 12.3 12.4  HCT 37.9 37.9  MCV 86.0 85.9  PLT 328 333    Cardiac Enzymes: Recent Labs  Lab 10/31/17 1036 10/31/17 2007  TROPONINI <0.03 <0.03    Lipid Panel: Recent Labs  Lab 10/31/17 2007  CHOL 129  TRIG 74  HDL 39*  CHOLHDL 3.3  VLDL 15  LDLCALC 75    CBG: Recent Labs  Lab 10/31/17 1826  GLUCAP 135*    Microbiology: Results for orders placed or performed during the hospital encounter of 10/31/17  MRSA PCR Screening     Status: None   Collection Time: 11/01/17 12:05 AM  Result Value Ref Range Status   MRSA by PCR NEGATIVE NEGATIVE Final    Comment:        The GeneXpert MRSA Assay (FDA approved for NASAL specimens only), is one component of a comprehensive MRSA colonization surveillance program. It is not intended to diagnose MRSA infection nor to guide or monitor treatment for MRSA infections. Performed at [facility], [address], [place], [place] [address]  Coagulation Studies: Recent Labs    10/31/17 12-29-05  LABPROT 13.4  INR 1.03    Imaging: Ct Angio Head W Or Wo Contrast  Result Date: 11/02/2017 CLINICAL DATA:  Stroke follow-up EXAM: CT ANGIOGRAPHY HEAD AND NECK TECHNIQUE: Multidetector CT imaging of the head and neck was performed using the standard protocol during bolus administration of intravenous contrast. Multiplanar CT image reconstructions and MIPs were obtained to evaluate the vascular anatomy. Carotid stenosis measurements (when applicable) are obtained utilizing NASCET criteria, using the distal internal carotid diameter as the denominator. CONTRAST:  72mL ISOVUE-370 IOPAMIDOL (ISOVUE-370) INJECTION 76% COMPARISON:  Noncontrast head CT from 2 days ago FINDINGS: CT  HEAD FINDINGS Brain: There is a moderate to large right lateral frontal infarct with cytotoxic edema pattern. Superficially there is some high density suggesting hemorrhage. Residual visible cortex could contribute to this appearance. There is a remote right parietal cortex infarct that is moderate size. Remote perforator infarct in the left basal ganglia. No hydrocephalus. Vascular: See below Skull: Negative Sinuses: Negative Orbits: Senescent globe calcifications.  No acute finding. Review of the MIP images confirms the above findings CTA NECK FINDINGS Aortic arch: Diffuse atherosclerotic plaque. No dilatation or dissection seen. Three vessel branching. Right carotid system: Atheromatous plaque at the common carotid bifurcation. No flow limiting stenosis or ulceration. Negative for beading or dissection. Left carotid system: Moderate atheromatous plaque at the common carotid bifurcation and ICA bulb. There is milder plaque along the common carotid. Proximal left ICA stenosis measures 60% on coronal reformats. No dissection or ulceration. Vertebral arteries: No proximal subclavian flow limiting stenosis. Codominant vertebral arteries. Mild atherosclerotic plaque at the right vertebral origin. Both vertebral arteries are diffusely patent and smooth. Skeleton: Usual degenerative changes. Osteopenia. No acute or aggressive finding. Other neck: 23 mm nodule in the left thyroid. 17 mm nodule in the right thyroid. These are likely incidental given patient's condition. The larger is peripherally calcified. Upper chest: Layering pleural effusions reaching the apex, also seen 10/20/2017 by CT. Review of the MIP images confirms the above findings CTA HEAD FINDINGS Anterior circulation: Atherosclerotic plaque on both carotid siphons. The left MCA is occluded at the origin with faint downstream reconstitution. On the right side that shows acute infarct there is no proximal stenosis or occlusion. Posterior circulation:  Codominant vertebral arteries. Vertebrobasilar arteries are smooth and diffusely patent. Symmetric and good flow in bilateral posterior cerebral arteries. Venous sinuses: Patent Anatomic variants: None unusual Delayed phase: No abnormal intracranial enhancement. These results were called by telephone at the time of interpretation on 11/02/2017 at 1:27 pm to Dr. Loney Hering , who verbally acknowledged these results. Review of the MIP images confirms the above findings IMPRESSION: 1. Moderate to large right frontal acute infarct. There is likely small volume superficial/petechial hemorrhage.No flow limiting stenosis or embolic source seen in the right anterior circulation. 2. Left MCA origin occlusion with limited branch opacification in the arterial phase. No visible left hemispheric infarct, this is an age indeterminate finding. 3. 60% atheromatous left ICA bulb narrowing. 4. Remote right parietal and left lateral lenticulostriate infarcts. 5. Layering pleural effusions also seen 10/20/2017. Electronically Signed   By: Monte Fantasia M.D.   On: 11/02/2017 13:31   Ct Angio Neck W Or Wo Contrast  Result Date: 11/02/2017 CLINICAL DATA:  Stroke follow-up EXAM: CT ANGIOGRAPHY HEAD AND NECK TECHNIQUE: Multidetector CT imaging of the head and neck was performed using the standard protocol during bolus administration of intravenous contrast. Multiplanar CT image reconstructions and MIPs  were obtained to evaluate the vascular anatomy. Carotid stenosis measurements (when applicable) are obtained utilizing NASCET criteria, using the distal internal carotid diameter as the denominator. CONTRAST:  53mL ISOVUE-370 IOPAMIDOL (ISOVUE-370) INJECTION 76% COMPARISON:  Noncontrast head CT from 2 days ago FINDINGS: CT HEAD FINDINGS Brain: There is a moderate to large right lateral frontal infarct with cytotoxic edema pattern. Superficially there is some high density suggesting hemorrhage. Residual visible cortex could contribute  to this appearance. There is a remote right parietal cortex infarct that is moderate size. Remote perforator infarct in the left basal ganglia. No hydrocephalus. Vascular: See below Skull: Negative Sinuses: Negative Orbits: Senescent globe calcifications.  No acute finding. Review of the MIP images confirms the above findings CTA NECK FINDINGS Aortic arch: Diffuse atherosclerotic plaque. No dilatation or dissection seen. Three vessel branching. Right carotid system: Atheromatous plaque at the common carotid bifurcation. No flow limiting stenosis or ulceration. Negative for beading or dissection. Left carotid system: Moderate atheromatous plaque at the common carotid bifurcation and ICA bulb. There is milder plaque along the common carotid. Proximal left ICA stenosis measures 60% on coronal reformats. No dissection or ulceration. Vertebral arteries: No proximal subclavian flow limiting stenosis. Codominant vertebral arteries. Mild atherosclerotic plaque at the right vertebral origin. Both vertebral arteries are diffusely patent and smooth. Skeleton: Usual degenerative changes. Osteopenia. No acute or aggressive finding. Other neck: 23 mm nodule in the left thyroid. 17 mm nodule in the right thyroid. These are likely incidental given patient's condition. The larger is peripherally calcified. Upper chest: Layering pleural effusions reaching the apex, also seen 10/20/2017 by CT. Review of the MIP images confirms the above findings CTA HEAD FINDINGS Anterior circulation: Atherosclerotic plaque on both carotid siphons. The left MCA is occluded at the origin with faint downstream reconstitution. On the right side that shows acute infarct there is no proximal stenosis or occlusion. Posterior circulation: Codominant vertebral arteries. Vertebrobasilar arteries are smooth and diffusely patent. Symmetric and good flow in bilateral posterior cerebral arteries. Venous sinuses: Patent Anatomic variants: None unusual Delayed  phase: No abnormal intracranial enhancement. These results were called by telephone at the time of interpretation on 11/02/2017 at 1:27 pm to Dr. Loney Hering , who verbally acknowledged these results. Review of the MIP images confirms the above findings IMPRESSION: 1. Moderate to large right frontal acute infarct. There is likely small volume superficial/petechial hemorrhage.No flow limiting stenosis or embolic source seen in the right anterior circulation. 2. Left MCA origin occlusion with limited branch opacification in the arterial phase. No visible left hemispheric infarct, this is an age indeterminate finding. 3. 60% atheromatous left ICA bulb narrowing. 4. Remote right parietal and left lateral lenticulostriate infarcts. 5. Layering pleural effusions also seen 10/20/2017. Electronically Signed   By: Monte Fantasia M.D.   On: 11/02/2017 13:31    Medications:  I have reviewed the patient's current medications. Scheduled: .  stroke: mapping our early stages of recovery book   Does not apply Once  . chlorhexidine  15 mL Mouth Rinse BID  . enoxaparin (LOVENOX) injection  40 mg Subcutaneous Q24H  . mouth rinse  15 mL Mouth Rinse q12n4p    Assessment/Plan: 84 year old female with embolic stroke related to atrial fibrillation.  Patient on aspirin at this time due to patient being n.p.o.  MRI was unable to be performed by CT and CTA were performed.  CT shows a relatively large right frontal infarct.  There is also some associated petechial hemorrhage.  CTA shows left MCA origin  occlusion.  There was no evidence of acute infarct in the left MCA region on CT.  Although patient remains in atrial fibrillation with rapid ventricular response would not start patient on anticoagulation at this time due to not only petechial hemorrhage but relatively large size of right-sided infarct.  Recommendations: 1.  Would discontinue Lovenox and continue aspirin rectally on a daily basis. 2.  PT/OT and speech  consults 3.  Neuro checks every shift     LOS: 3 days   Alexis Goodell, MD Neurology [phone] 11/03/2017  11:05 AM

## 2017-11-03 NOTE — Progress Notes (Signed)
OT Cancellation Note  Patient Details Name: Emily Moyer MRN: [id number] DOB: May 21, 1924   Cancelled Treatment:    Reason Eval/Treat Not Completed: Medical issues which prohibited therapy. Chart reviewed. Spoke with PT after PT attempt, pt with continued elevation in HR despite current intervention.  Per primary RN, patient with recent amioderone bolus and pending discussion with cardiology for other rate-controlling options.  Consistently fluctuating 120-140s in supine at rest.  Will continue medical hold at this time and re-attempt next date as appropriate.  Jeni Salles, MPH, MS, OTR/L ascom [phone] 11/03/17, 2:54 PM

## 2017-11-03 NOTE — Progress Notes (Signed)
[facility] [place] NAME: Emily Moyer    MR#:  [id number]  DATE OF BIRTH:  05-10-1924  SUBJECTIVE:  CHIEF COMPLAINT:   Chief Complaint  Patient presents with  . Altered Mental Status  Patient is still encephalopathic, case discussed with intensivist, neurology notes reviewed  REVIEW OF SYSTEMS:  CONSTITUTIONAL: No fever, fatigue or weakness.  EYES: No blurred or double vision.  EARS, NOSE, AND THROAT: No tinnitus or ear pain.  RESPIRATORY: No cough, shortness of breath, wheezing or hemoptysis.  CARDIOVASCULAR: No chest pain, orthopnea, edema.  GASTROINTESTINAL: No nausea, vomiting, diarrhea or abdominal pain.  GENITOURINARY: No dysuria, hematuria.  ENDOCRINE: No polyuria, nocturia,  HEMATOLOGY: No anemia, easy bruising or bleeding SKIN: No rash or lesion. MUSCULOSKELETAL: No joint pain or arthritis.   NEUROLOGIC: No tingling, numbness, weakness.  PSYCHIATRY: No anxiety or depression.   ROS  DRUG ALLERGIES:   Allergies  Allergen Reactions  . Aspirin-Dipyridamole Er     Other reaction(s): Unknown  . Dipyridamole     Other reaction(s): Unknown  . Lyrica [Pregabalin] Swelling  . Clonidine Other (See Comments)    Dry mouth  . Cymbalta  [Duloxetine Hcl]     Other reaction(s): Other (See Comments) insomnia  . Duloxetine     Other reaction(s): Other (See Comments) insomnia    VITALS:  Blood pressure 125/68, pulse (!) 137, temperature 98 F (36.7 C), temperature source Axillary, resp. rate (!) 24, weight 63.9 kg (140 lb 14 oz), SpO2 92 %.  PHYSICAL EXAMINATION:  GENERAL:  84 y.o.-year-old patient lying in the bed with no acute distress.  EYES: Pupils equal, round, reactive to light and accommodation. No scleral icterus. Extraocular muscles intact.  HEENT: Head atraumatic, normocephalic. Oropharynx and nasopharynx clear.  NECK:  Supple, no jugular venous distention. No thyroid enlargement, no tenderness.  LUNGS: Normal  breath sounds bilaterally, no wheezing, rales,rhonchi or crepitation. No use of accessory muscles of respiration.  CARDIOVASCULAR: S1, S2 normal. No murmurs, rubs, or gallops.  ABDOMEN: Soft, nontender, nondistended. Bowel sounds present. No organomegaly or mass.  EXTREMITIES: No pedal edema, cyanosis, or clubbing.  NEUROLOGIC: Cranial nerves II through XII are intact. Muscle strength 5/5 in all extremities. Sensation intact. Gait not checked.  PSYCHIATRIC: The patient is alert and oriented x 3.  SKIN: No obvious rash, lesion, or ulcer.   Physical Exam LABORATORY PANEL:   CBC Recent Labs  Lab 11/01/17 0524  WBC 15.3*  HGB 12.4  HCT 37.9  PLT 333   ------------------------------------------------------------------------------------------------------------------  Chemistries  Recent Labs  Lab 10/31/17 1036  11/03/17 0532  NA 136   < > 138  K 3.7   < > 3.4*  CL 99*   < > 102  CO2 23   < > 23  GLUCOSE 147*   < > 134*  BUN 29*   < > 29*  CREATININE 1.53*   < > 1.04*  CALCIUM 9.6   < > 9.8  MG  --    < > 2.0  AST 46*  --   --   ALT 49  --   --   ALKPHOS 135*  --   --   BILITOT 1.0  --   --    < > = values in this interval not displayed.   ------------------------------------------------------------------------------------------------------------------  Cardiac Enzymes Recent Labs  Lab 10/31/17 1036 10/31/17 2007  TROPONINI <0.03 <0.03   ------------------------------------------------------------------------------------------------------------------  RADIOLOGY:  Ct Angio Head W Or Wo Contrast  Result Date: 11/02/2017 CLINICAL DATA:  Stroke follow-up EXAM: CT ANGIOGRAPHY HEAD AND NECK TECHNIQUE: Multidetector CT imaging of the head and neck was performed using the standard protocol during bolus administration of intravenous contrast. Multiplanar CT image reconstructions and MIPs were obtained to evaluate the vascular anatomy. Carotid stenosis measurements (when  applicable) are obtained utilizing NASCET criteria, using the distal internal carotid diameter as the denominator. CONTRAST:  81mL ISOVUE-370 IOPAMIDOL (ISOVUE-370) INJECTION 76% COMPARISON:  Noncontrast head CT from 2 days ago FINDINGS: CT HEAD FINDINGS Brain: There is a moderate to large right lateral frontal infarct with cytotoxic edema pattern. Superficially there is some high density suggesting hemorrhage. Residual visible cortex could contribute to this appearance. There is a remote right parietal cortex infarct that is moderate size. Remote perforator infarct in the left basal ganglia. No hydrocephalus. Vascular: See below Skull: Negative Sinuses: Negative Orbits: Senescent globe calcifications.  No acute finding. Review of the MIP images confirms the above findings CTA NECK FINDINGS Aortic arch: Diffuse atherosclerotic plaque. No dilatation or dissection seen. Three vessel branching. Right carotid system: Atheromatous plaque at the common carotid bifurcation. No flow limiting stenosis or ulceration. Negative for beading or dissection. Left carotid system: Moderate atheromatous plaque at the common carotid bifurcation and ICA bulb. There is milder plaque along the common carotid. Proximal left ICA stenosis measures 60% on coronal reformats. No dissection or ulceration. Vertebral arteries: No proximal subclavian flow limiting stenosis. Codominant vertebral arteries. Mild atherosclerotic plaque at the right vertebral origin. Both vertebral arteries are diffusely patent and smooth. Skeleton: Usual degenerative changes. Osteopenia. No acute or aggressive finding. Other neck: 23 mm nodule in the left thyroid. 17 mm nodule in the right thyroid. These are likely incidental given patient's condition. The larger is peripherally calcified. Upper chest: Layering pleural effusions reaching the apex, also seen 10/20/2017 by CT. Review of the MIP images confirms the above findings CTA HEAD FINDINGS Anterior circulation:  Atherosclerotic plaque on both carotid siphons. The left MCA is occluded at the origin with faint downstream reconstitution. On the right side that shows acute infarct there is no proximal stenosis or occlusion. Posterior circulation: Codominant vertebral arteries. Vertebrobasilar arteries are smooth and diffusely patent. Symmetric and good flow in bilateral posterior cerebral arteries. Venous sinuses: Patent Anatomic variants: None unusual Delayed phase: No abnormal intracranial enhancement. These results were called by telephone at the time of interpretation on 11/02/2017 at 1:27 pm to Dr. Loney Hering , who verbally acknowledged these results. Review of the MIP images confirms the above findings IMPRESSION: 1. Moderate to large right frontal acute infarct. There is likely small volume superficial/petechial hemorrhage.No flow limiting stenosis or embolic source seen in the right anterior circulation. 2. Left MCA origin occlusion with limited branch opacification in the arterial phase. No visible left hemispheric infarct, this is an age indeterminate finding. 3. 60% atheromatous left ICA bulb narrowing. 4. Remote right parietal and left lateral lenticulostriate infarcts. 5. Layering pleural effusions also seen 10/20/2017. Electronically Signed   By: Monte Fantasia M.D.   On: 11/02/2017 13:31   Ct Angio Neck W Or Wo Contrast  Result Date: 11/02/2017 CLINICAL DATA:  Stroke follow-up EXAM: CT ANGIOGRAPHY HEAD AND NECK TECHNIQUE: Multidetector CT imaging of the head and neck was performed using the standard protocol during bolus administration of intravenous contrast. Multiplanar CT image reconstructions and MIPs were obtained to evaluate the vascular anatomy. Carotid stenosis measurements (when applicable) are obtained utilizing NASCET criteria, using the distal internal carotid diameter as the denominator. CONTRAST:  52mL ISOVUE-370 IOPAMIDOL (ISOVUE-370) INJECTION 76% COMPARISON:  Noncontrast head CT from 2  days ago FINDINGS: CT HEAD FINDINGS Brain: There is a moderate to large right lateral frontal infarct with cytotoxic edema pattern. Superficially there is some high density suggesting hemorrhage. Residual visible cortex could contribute to this appearance. There is a remote right parietal cortex infarct that is moderate size. Remote perforator infarct in the left basal ganglia. No hydrocephalus. Vascular: See below Skull: Negative Sinuses: Negative Orbits: Senescent globe calcifications.  No acute finding. Review of the MIP images confirms the above findings CTA NECK FINDINGS Aortic arch: Diffuse atherosclerotic plaque. No dilatation or dissection seen. Three vessel branching. Right carotid system: Atheromatous plaque at the common carotid bifurcation. No flow limiting stenosis or ulceration. Negative for beading or dissection. Left carotid system: Moderate atheromatous plaque at the common carotid bifurcation and ICA bulb. There is milder plaque along the common carotid. Proximal left ICA stenosis measures 60% on coronal reformats. No dissection or ulceration. Vertebral arteries: No proximal subclavian flow limiting stenosis. Codominant vertebral arteries. Mild atherosclerotic plaque at the right vertebral origin. Both vertebral arteries are diffusely patent and smooth. Skeleton: Usual degenerative changes. Osteopenia. No acute or aggressive finding. Other neck: 23 mm nodule in the left thyroid. 17 mm nodule in the right thyroid. These are likely incidental given patient's condition. The larger is peripherally calcified. Upper chest: Layering pleural effusions reaching the apex, also seen 10/20/2017 by CT. Review of the MIP images confirms the above findings CTA HEAD FINDINGS Anterior circulation: Atherosclerotic plaque on both carotid siphons. The left MCA is occluded at the origin with faint downstream reconstitution. On the right side that shows acute infarct there is no proximal stenosis or occlusion. Posterior  circulation: Codominant vertebral arteries. Vertebrobasilar arteries are smooth and diffusely patent. Symmetric and good flow in bilateral posterior cerebral arteries. Venous sinuses: Patent Anatomic variants: None unusual Delayed phase: No abnormal intracranial enhancement. These results were called by telephone at the time of interpretation on 11/02/2017 at 1:27 pm to Dr. Loney Hering , who verbally acknowledged these results. Review of the MIP images confirms the above findings IMPRESSION: 1. Moderate to large right frontal acute infarct. There is likely small volume superficial/petechial hemorrhage.No flow limiting stenosis or embolic source seen in the right anterior circulation. 2. Left MCA origin occlusion with limited branch opacification in the arterial phase. No visible left hemispheric infarct, this is an age indeterminate finding. 3. 60% atheromatous left ICA bulb narrowing. 4. Remote right parietal and left lateral lenticulostriate infarcts. 5. Layering pleural effusions also seen 10/20/2017. Electronically Signed   By: Monte Fantasia M.D.   On: 11/02/2017 13:31    ASSESSMENT AND PLAN:  Patient is a 84 year old white female with history of atrial fibrillation presenting with strokelike symptoms  1.Acute large right frontal lobe infarct  CT angiogram of the head and neck noted for large right frontal lobe infarct and occlusion of left MCA in discussion with radiology, continue CVA protocol, echocardiogram noted for ejection fraction 55-65%, patient is not a good candidate for anticoagulation-cardiology agrees, suspect patient will require feeding tube-family discussing this issue, neurology input greatly appreciated-aspirin daily  2.Acute A. fib with RVRstable Stable on amiodarone and Cardizem drips Cardiology following, patient is a poor candidate for anticoagulation Echocardiogram noted above  3.Essential hypertension, chronic Stable  4.Medical noncompliance Noted  previous admission patient leftAMA DOSinvolved, psychiatry did see patient while in house - pt is non-verbal/refusing to take meds  5.Chronic noncompliance of medical management Stable  DNR  status Long-term prognosis is dismal  Disposition to nursing home if family in agreement  All the records are reviewed and case discussed with Care Management/Social Workerr. Management plans discussed with the patient, family and they are in agreement.  CODE STATUS: dnr  TOTAL TIME TAKING CARE OF THIS PATIENT: 35 minutes.     POSSIBLE D/C IN 2-5 DAYS, DEPENDING ON CLINICAL CONDITION.   Avel Peace Salary M.D on 11/03/2017   Between 7am to 6pm - Pager - [phone]  After 6pm go to [email or web address] - password [email or web address]  Office  [phone]  CC: Primary care physician; Ezequiel Kayser, MD  Note: This dictation was prepared with Dragon dictation along with smaller phrase technology. Any transcriptional errors that result from this process are unintentional.

## 2017-11-03 NOTE — Progress Notes (Signed)
Pharmacy Electrolyte Monitoring Consult:  Pharmacy consulted to assist in monitoring and replacing electrolytes in this 84 y.o. female admitted on 10/31/2017 with Altered Mental Status   Labs:  Sodium (mmol/L)  Date Value  11/03/2017 138   Potassium (mmol/L)  Date Value  11/03/2017 3.4 (L)   Magnesium (mg/dL)  Date Value  11/03/2017 2.0   Phosphorus (mg/dL)  Date Value  11/02/2017 3.0   Calcium (mg/dL)  Date Value  11/03/2017 9.8   Albumin (g/dL)  Date Value  10/31/2017 3.5    Plan: Will replace K+ with KCL 11mEq IV Q1hr x 2hours.   Will recheck electrolytes with am labs.   Pharmacy will continue to monitor and adjust per consult.   Pernell Dupre, PharmD, BCPS Clinical Pharmacist 11/03/2017 8:32 AM

## 2017-11-03 NOTE — Progress Notes (Signed)
PHARMACIST - PHYSICIAN COMMUNICATION  CONCERNING:  Enoxaparin (Lovenox) for DVT Prophylaxis    RECOMMENDATION: Patient was prescribed enoxaprin 30mg  q24 hours for VTE prophylaxis.   Filed Weights   11/03/17 0800  Weight: 140 lb 14 oz (63.9 kg)    Body mass index is 22.74 kg/m.  Estimated Creatinine Clearance: 33 mL/min (A) (by C-G formula based on SCr of 1.04 mg/dL (H)).  Based on Minor patient is candidate for enoxaparin 40mg  every 24 hours based on CrCl 8mL/min and weight >45kg  DESCRIPTION: Pharmacy has adjusted enoxaparin dose per [facility] policy, approved through [place] committee.  Patient is now receiving enoxaparin 40mg  every 24 hours.    Nancy Fetter, PharmD Clinical Pharmacist  11/03/2017 10:58 AM

## 2017-11-04 LAB — THYROID PANEL WITH TSH
FREE THYROXINE INDEX: 2.2 (ref 1.2–4.9)
T3 UPTAKE RATIO: 30 % (ref 24–39)
T4, Total: 7.2 ug/dL (ref 4.5–12.0)
TSH: 2.93 u[IU]/mL (ref 0.450–4.500)

## 2017-11-04 LAB — BASIC METABOLIC PANEL
ANION GAP: 14 (ref 5–15)
BUN: 26 mg/dL — ABNORMAL HIGH (ref 6–20)
CALCIUM: 9.6 mg/dL (ref 8.9–10.3)
CO2: 21 mmol/L — ABNORMAL LOW (ref 22–32)
Chloride: 103 mmol/L (ref 101–111)
Creatinine, Ser: 0.89 mg/dL (ref 0.44–1.00)
GFR calc Af Amer: >60 mL/min (ref >60)
GFR, EST NON AFRICAN AMERICAN: 55 mL/min — AB (ref >60)
GLUCOSE: 125 mg/dL — AB (ref 65–99)
POTASSIUM: 4 mmol/L (ref 3.5–5.1)
SODIUM: 138 mmol/L (ref 135–145)

## 2017-11-04 LAB — MAGNESIUM: MAGNESIUM: 1.8 mg/dL (ref 1.7–2.4)

## 2017-11-04 MED ORDER — AMIODARONE IV BOLUS ONLY 150 MG/100ML
INTRAVENOUS | Status: AC
  Administered 2017-11-04: 150 mg via INTRAVENOUS
  Filled 2017-11-04: qty 100

## 2017-11-04 MED ORDER — DILTIAZEM HCL 100 MG IV SOLR
5.0000 mg/h | INTRAVENOUS | Status: DC
  Administered 2017-11-04: 15 mg/h via INTRAVENOUS
  Administered 2017-11-04: 5 mg/h via INTRAVENOUS
  Administered 2017-11-05 – 2017-11-06 (×6): 15 mg/h via INTRAVENOUS
  Filled 2017-11-04 (×9): qty 100

## 2017-11-04 MED ORDER — AMIODARONE IV BOLUS ONLY 150 MG/100ML
150.0000 mg | Freq: Once | INTRAVENOUS | Status: AC
  Administered 2017-11-04: 150 mg via INTRAVENOUS

## 2017-11-04 MED ORDER — DILTIAZEM LOAD VIA INFUSION
5.0000 mg | Freq: Once | INTRAVENOUS | Status: AC
Start: 2017-11-04 — End: 2017-11-04
  Administered 2017-11-04: 5 mg via INTRAVENOUS
  Filled 2017-11-04: qty 5

## 2017-11-04 MED ORDER — ASPIRIN 300 MG RE SUPP: 300.0000 mg | Freq: Every day | RECTAL | Status: DC

## 2017-11-04 MED ORDER — AMIODARONE IV BOLUS ONLY 150 MG/100ML: 150.0000 mg | Freq: Once | INTRAVENOUS | Status: DC

## 2017-11-04 MED ORDER — DIGOXIN 0.25 MG/ML IJ SOLN
0.2500 mg | Freq: Once | INTRAMUSCULAR | Status: AC
  Administered 2017-11-04: 0.25 mg via INTRAVENOUS
  Filled 2017-11-04: qty 1

## 2017-11-04 NOTE — Progress Notes (Signed)
Follow up - Critical Care Medicine Note  Patient Details:    Emily Moyer is an 84 y.o. female.  Emily Moyer is a 84 year old female with a past medical history remarkable for hypertension, diabetes, neuropathy, CVA, breast carcinoma, atrial fibrillation on aspirin due to falls,receently hospitalized here recently with a UTI and pneumonia who left AGAINST MEDICAL ADVICE recently. She is brought in by her daughter because she was found sitting on the side of her bed with right-sided gaze deviation, slurred speech and left-sided neglect by her daughter.    CC follow up mental status changes  HPI More alert today Patient is DNR/DNI afib not controlled on amiodarone infusion   CT HEAD 1. Moderate to large right frontal acute infarct. There is likely small volume superficial/petechial hemorrhage.No flow limiting stenosis or embolic source seen in the right anterior circulation. 2. Left MCA origin occlusion with limited branch opacification in the arterial phase. No visible left hemispheric infarct, this is an age indeterminate finding. 3. 60% atheromatous left ICA bulb narrowing. 4. Remote right parietal and left lateral lenticulostriate infarcts.     Consults: Treatment Team:  Teodoro Spray, MD Clovis Fredrickson, MD Leotis Pain, MD Clapacs, Madie Reno, MD    Objective:  Vital signs for last 24 hours: Temp:  [97.4 F (36.3 C)-98.2 F (36.8 C)] 97.4 F (36.3 C) (02/13 0154) Pulse Rate:  [108-148] 123 (02/13 0600) Resp:  [15-45] 24 (02/13 0600) BP: (106-141)/(59-110) 139/73 (02/13 0600) SpO2:  [87 %-99 %] 97 % (02/13 0600) Weight:  [140 lb 14 oz (63.9 kg)] 140 lb 14 oz (63.9 kg) (02/12 0800)    Intake/Output from previous day: 02/12 0701 - 02/13 0700 In: 614.9 [I.V.:614.9] Out: 540 [Urine:540]  Intake/Output this shift: No intake/output data recorded.   Review of Systems: No pain Other ROS limited  Physical Exam:  Patient stuperous   HEENT: No respiratory distress, trachea is midline, left-sided facial droop Cardiovascular: Patient with atrial fibrillation, irregularly irregular rhythm, ventricular responses in the 140's Pulmonary: Scant basilar crackles appreciated right greater than left Abdominal: Positive bowel sounds, soft exam Extremities: No clubbing cyanosis or edema noted Neurologic: Patient with right-sided gaze, left-sided neglect with facial asymmetry does not remove all extremities   Assessment/Plan:  84 yo female with history of afib not anticoagulated due to comorbidity and fall history who was admitted after being noted to have right sided gaze preference and left sided neglect c/w with acute cva.     CVA  Will need to update family and address goals of care  Congestive heart failure. Patient diuresed 1.1 L. Presently on nasal cannula   Rapid atrial fibrillation.  Patient starting on a amiodarone infusion-follow up cardiology recs  Hypertension. Blood pressure stable as morning  Leukocytosis. Leukocytosis of 15.3. No clear evidence of infection noted  Renal insufficiency.  Will follow    Overall prognosis is poor Recommend palliative care consult and hospice  Transfer to floor when HR stable   Emily Moyer Patricia Pesa, M.D.  [facility]  Medical Director [place] Director [facility] Cardio-Pulmonary Department

## 2017-11-04 NOTE — Plan of Care (Signed)
Attempted to speak with patient, she is confused and unable to answer questions appropriately. Reached daughter Emily Moyer at work phone number. She states she is at work today and her car is broken and she has to rely on a friend for a ride. She states she will not be able to come to the hospital until tomorrow sometime after 5:00pm, and will not be available on other days prior to at least 5:00 due to the need for a ride. She states someone mentioned a feeding tube, but she does not want to discuss these things over the phone and will only do it in person. Additionally, she states she only has a few minutes at a time on the phone and it is very noisy.   Please call if we can be of service.

## 2017-11-04 NOTE — Progress Notes (Signed)
PT Cancellation Note  Patient Details Name: Emily Moyer MRN: [id number] DOB: 1924-04-08   Cancelled Treatment:    Reason Eval/Treat Not Completed: Medical issues which prohibited therapy(Patient continues with significant issues with HR control (currently 143 resting in bed); not appropriate for PT intervention at this time.  As patient has remained medically unstable and inappropriate for PT intervention x4 consecutive days, will complete initial order at this time.  Please re-consult as patient stabilizes and is medically appropriate for initiation of exertional activity.)   Ediberto Sens H. Owens Shark, PT, DPT, NCS 11/04/17, 11:29 AM [phone]

## 2017-11-04 NOTE — Progress Notes (Signed)
OT Cancellation Note  Patient Details Name: Emily Moyer MRN: [id number] DOB: 10-16-1923   Cancelled Treatment:    Reason Eval/Treat Not Completed: Medical issues which prohibited therapy;Patient not medically ready. Patient continues with significant issues with HR control. Not appropriate for OT intervention at this time.  As patient has remained medically unstable and inappropriate for OT intervention x4 consecutive days, will complete initial order at this time.  Please re-consult as patient stabilizes and is medically appropriate for initiation of exertional activity.  Jeni Salles, MPH, MS, OTR/L ascom [phone] 11/04/17, 11:43 AM

## 2017-11-04 NOTE — Progress Notes (Signed)
Speech Language Pathology Treatment: Dysphagia  Patient Details Name: Emily Moyer MRN: [id number] DOB: 16-Apr-1924 Today's Date: 11/04/2017 Time: 1600-1700 SLP Time Calculation (min) (ACUTE ONLY): 60 min  Assessment / Plan / Recommendation Clinical Impression  Pt was seen this PM for ongoing trials of po's to both stimulate and engage pt's swallow function. Noted moderate oral weakness on Left side; moderate lingual weakness, and Dysarthria. Pt was awake and alert to her immediate circumstances. Family/friend present sitting w/ pt to provide stimulation. Pt much calmer today.  Pt positioned upright w/ head forward and given 1/2 TSP trials of Honey consistency liquids and applesauce (puree) w/ mod+ verbal/tactile/visual cues to swallow and clear. Pt exhibited both oral phase deficits and pharyngeal phase deficits but w/ time and cues, she demonstrated an adequate swallow to clear the bolus material. When pt attempted talking b/f swallowing, she may have experienced some laryngeal penetration or aspiration as a delayed cough was noted - this only occurred 1x w/ trials(~15 tsp boluses total). Pt seemed to begin to get the hang of the task as she continued. It did appear tiring for her; noted min increase in HR; no decline in O2 sats or RR w/ trials.  For pt to have opportunity to improve her swallow function, she needs to practice swallowing. Discussed w/ NSG and recommended 3(1/2) TSP trials of puree and Honey liquids each later this PM b/f bedtime in order to exercise the swallowing. MUST follow strict aspiration precautions and check for full swallowing/clearing before giving another bolus. Verbal/tactile/visual cues. IF pt exhibits any increased s/s of aspiration, then stop the po trial exercise and let pt rest. Recommend reduced distractions and talking during the exercise. Consulted w/ NSG who agreed. Nursing order placed in chart. ST services will f/u tomorrow w/ ongoing assessment.      HPI HPI: Pt is a 84 y.o. female with a known history of multiple medical issues including CVA, memory issues, chronic atrial fibrillation only on aspirin due to falls, essential hypertension previous CVA who was actually hospitalized here recently with a UTI and pneumonia who left AGAINST MEDICAL ADVICE recently.  She is brought in by her daughter because she was found sitting on the side of her bed with right-sided gaze deviation, slurred speech and left-sided neglect by her daughter.  Patient does have a slurred speech she wants to go home.  According to her daughter her mother is noncompliant with all her medications other than aspirin. Pt just had a recent admission w/ dx'd pnuemonia and UTI per chart. Unable to obtain MRI due to desaturation and agitation. Pt has been coughing up phlegm per NSG report. HR remains elevated; Cardiology following. Per CT of the head, a moderate to large right frontal acute infarct is noted. Pt does exhibit Left oral weakness; dysarthria.       SLP Plan  Continue with current plan of care       Recommendations  Diet recommendations: NPO Liquids provided via: Teaspoon(trials w/ NSG by ) Medication Administration: Via alternative means Supervision: Full supervision/cueing for compensatory strategies Compensations: Minimize environmental distractions;Slow rate;Small sips/bites;Lingual sweep for clearance of pocketing;Multiple dry swallows after each bite/sip;Follow solids with liquid(w/ trials w/ NSG) Postural Changes and/or Swallow Maneuvers: Seated upright 90 degrees;Upright 30-60 min after meal                General recommendations: (Dietician f/u) Oral Care Recommendations: Oral care QID;Staff/trained caregiver to provide oral care Follow up Recommendations: Skilled Nursing facility SLP Visit Diagnosis: Dysphagia, oropharyngeal  phase (R13.12) Plan: Continue with current plan of care       [place], [place],  CCC-SLP Watson,Katherine 11/04/2017, 5:40 PM

## 2017-11-04 NOTE — Progress Notes (Signed)
[facility] [place] NAME: Emily Moyer    MR#:  [id number]  DATE OF BIRTH:  1924/02/27  SUBJECTIVE:  CHIEF COMPLAINT:   Chief Complaint  Patient presents with  . Altered Mental Status  remains encephalopathic, tachycardic REVIEW OF SYSTEMS:   ROS: unable to be obtained due to encephalopathy  DRUG ALLERGIES:   Allergies  Allergen Reactions  . Aspirin-Dipyridamole Er     Other reaction(s): Unknown  . Dipyridamole     Other reaction(s): Unknown  . Lyrica [Pregabalin] Swelling  . Clonidine Other (See Comments)    Dry mouth  . Cymbalta  [Duloxetine Hcl]     Other reaction(s): Other (See Comments) insomnia  . Duloxetine     Other reaction(s): Other (See Comments) insomnia    VITALS:  Blood pressure 124/62, pulse (!) 129, temperature 97.8 F (36.6 C), temperature source Axillary, resp. rate 16, weight 63.9 kg (140 lb 14 oz), SpO2 100 %.  PHYSICAL EXAMINATION:  GENERAL:  84 y.o.-year-old patient lying in the bed with no acute distress.  EYES: Pupils equal, round, reactive to light and accommodation. No scleral icterus. Extraocular muscles intact.  HEENT: Head atraumatic, normocephalic. Oropharynx and nasopharynx clear.  NECK:  Supple, no jugular venous distention. No thyroid enlargement, no tenderness.  LUNGS: Normal breath sounds bilaterally, no wheezing, rales,rhonchi or crepitation. No use of accessory muscles of respiration.  CARDIOVASCULAR: S1, S2 normal. No murmurs, rubs, or gallops.  ABDOMEN: Soft, nontender, nondistended. Bowel sounds present. No organomegaly or mass.  EXTREMITIES: No pedal edema, cyanosis, or clubbing.  NEUROLOGIC: Cranial nerves II through XII are intact. Muscle strength 5/5 in all extremities. Sensation intact. Gait not checked.  PSYCHIATRIC: The patient is alert and confused SKIN: No obvious rash, lesion, or ulcer.   Physical Exam LABORATORY PANEL:   CBC Recent Labs  Lab 11/01/17 0524  WBC  15.3*  HGB 12.4  HCT 37.9  PLT 333   ------------------------------------------------------------------------------------------------------------------  Chemistries  Recent Labs  Lab 10/31/17 1036  11/04/17 0530  NA 136   < > 138  K 3.7   < > 4.0  CL 99*   < > 103  CO2 23   < > 21*  GLUCOSE 147*   < > 125*  BUN 29*   < > 26*  CREATININE 1.53*   < > 0.89  CALCIUM 9.6   < > 9.6  MG  --    < > 1.8  AST 46*  --   --   ALT 49  --   --   ALKPHOS 135*  --   --   BILITOT 1.0  --   --    < > = values in this interval not displayed.   ------------------------------------------------------------------------------------------------------------------  Cardiac Enzymes Recent Labs  Lab 10/31/17 1036 10/31/17 2007  TROPONINI <0.03 <0.03   ------------------------------------------------------------------------------------------------------------------  RADIOLOGY:  No results found.  ASSESSMENT AND PLAN:  Patient is a 84 year old white female with history of atrial fibrillation presenting with strokelike symptoms  1.Acute large right frontal lobe infarct  CT angiogram of the head and neck noted for large right frontal lobe infarct and occlusion of left MCA in discussion with radiology, continue CVA protocol, echocardiogram noted for ejection fraction 55-65%, patient is not a good candidate for anticoagulation-cardiology agrees, suspect patient will require feeding tube-family discussing this issue, neurology input greatly appreciated-aspirin daily  2.Acute A. fib with RVR - on amiodarone and Cardizem drips Cardiology following, patient is a poor candidate for  anticoagulation Echocardiogram noted above  3.Essential hypertension, chronic Stable  4.Medical noncompliance Noted previous admission patient leftAMA DOSinvolved, psychiatry did see patient while in house - pt is non-verbal/refusing to take meds  5.Chronic noncompliance of medical  management Stable  DNR status Long-term prognosis is dismal  Disposition to nursing home if family in agreement  All the records are reviewed and case discussed with Care Management/Social Workerr. Management plans discussed with the patient, nursing and they are in agreement.  CODE STATUS: dnr  TOTAL TIME TAKING CARE OF THIS PATIENT: 15 minutes.       Max Sane M.D on 11/04/2017   Between 7am to 6pm - Pager - [phone]  After 6pm go to [email or web address] - password [email or web address] [facility]  Office  [phone]  CC: Primary care physician; Ezequiel Kayser, MD  Note: This dictation was prepared with Dragon dictation along with smaller phrase technology. Any transcriptional errors that result from this process are unintentional.

## 2017-11-04 NOTE — Progress Notes (Addendum)
Patient Name: Emily Moyer Date of Encounter: 11/04/2017  Hospital Problem List     Active Problems:   CVA (cerebral vascular accident) [facility])    Patient Profile     84 yo female with history of afib treated with antiplatelet therapy with asa alone due ot history of falls. Now admitted with afib with rvr and cva.  Subjective   Minimal responsive.   Inpatient Medications    .  stroke: mapping our early stages of recovery book   Does not apply Once  . chlorhexidine  15 mL Mouth Rinse BID  . enoxaparin (LOVENOX) injection  40 mg Subcutaneous Q24H  . mouth rinse  15 mL Mouth Rinse q12n4p    Vital Signs    Vitals:   11/04/17 0200 11/04/17 0400 11/04/17 0500 11/04/17 0600  BP: (!) 115/59 130/85 124/74 139/73  Pulse: (!) 119 (!) 122 (!) 108 (!) 123  Resp: 17 19 15  (!) 24  Temp:      TempSrc:      SpO2: 98% 98% 96% 97%  Weight:        Intake/Output Summary (Last 24 hours) at 11/04/2017 0751 Last data filed at 11/04/2017 0600 Gross per 24 hour  Intake 614.94 ml  Output 540 ml  Net 74.94 ml   Filed Weights   11/03/17 0800  Weight: 63.9 kg (140 lb 14 oz)    Physical Exam    GEN: Well nourished, well developed, in no acute distress.  HEENT: normal.  Neck: Supple, no JVD, carotid bruits, or masses. Cardiac: irr, irr, no murmurs, rubs, or gallops. No clubbing, cyanosis, edema.  Radials/DP/PT 2+ and equal bilaterally.  Respiratory:  Respirations regular and unlabored, clear to auscultation bilaterally. GI: Soft, nontender, nondistended, BS + x 4. MS: no deformity or atrophy. Skin: warm and dry, no rash. Neuro: Minimally responsive Psych: Normal affect.  Labs    CBC No results for input(s): WBC, NEUTROABS, HGB, HCT, MCV, PLT in the last 72 hours. Basic Metabolic Panel Recent Labs    11/02/17 0516 11/03/17 0532 11/04/17 0530  NA 137 138 138  K 3.2* 3.4* 4.0  CL 99* 102 103  CO2 24 23 21*  GLUCOSE 135* 134* 125*  BUN 29* 29* 26*  CREATININE 1.33*  1.04* 0.89  CALCIUM 9.8 9.8 9.6  MG 2.2 2.0 1.8  PHOS 3.0  --   --    Liver Function Tests No results for input(s): AST, ALT, ALKPHOS, BILITOT, PROT, ALBUMIN in the last 72 hours. No results for input(s): LIPASE, AMYLASE in the last 72 hours. Cardiac Enzymes No results for input(s): CKTOTAL, CKMB, CKMBINDEX, TROPONINI in the last 72 hours. BNP No results for input(s): BNP in the last 72 hours. D-Dimer No results for input(s): DDIMER in the last 72 hours. Hemoglobin A1C No results for input(s): HGBA1C in the last 72 hours. Fasting Lipid Panel No results for input(s): CHOL, HDL, LDLCALC, TRIG, CHOLHDL, LDLDIRECT in the last 72 hours. Thyroid Function Tests Recent Labs    11/03/17 0524  TSH 2.930  T4TOTAL 7.2    Telemetry    afib with rvr  ECG    afib with rvr  Radiology    Ct Angio Head W Or Wo Contrast  Result Date: 11/02/2017 CLINICAL DATA:  Stroke follow-up EXAM: CT ANGIOGRAPHY HEAD AND NECK TECHNIQUE: Multidetector CT imaging of the head and neck was performed using the standard protocol during bolus administration of intravenous contrast. Multiplanar CT image reconstructions and MIPs were obtained to evaluate  the vascular anatomy. Carotid stenosis measurements (when applicable) are obtained utilizing NASCET criteria, using the distal internal carotid diameter as the denominator. CONTRAST:  75mL ISOVUE-370 IOPAMIDOL (ISOVUE-370) INJECTION 76% COMPARISON:  Noncontrast head CT from 2 days ago FINDINGS: CT HEAD FINDINGS Brain: There is a moderate to large right lateral frontal infarct with cytotoxic edema pattern. Superficially there is some high density suggesting hemorrhage. Residual visible cortex could contribute to this appearance. There is a remote right parietal cortex infarct that is moderate size. Remote perforator infarct in the left basal ganglia. No hydrocephalus. Vascular: See below Skull: Negative Sinuses: Negative Orbits: Senescent globe calcifications.  No acute  finding. Review of the MIP images confirms the above findings CTA NECK FINDINGS Aortic arch: Diffuse atherosclerotic plaque. No dilatation or dissection seen. Three vessel branching. Right carotid system: Atheromatous plaque at the common carotid bifurcation. No flow limiting stenosis or ulceration. Negative for beading or dissection. Left carotid system: Moderate atheromatous plaque at the common carotid bifurcation and ICA bulb. There is milder plaque along the common carotid. Proximal left ICA stenosis measures 60% on coronal reformats. No dissection or ulceration. Vertebral arteries: No proximal subclavian flow limiting stenosis. Codominant vertebral arteries. Mild atherosclerotic plaque at the right vertebral origin. Both vertebral arteries are diffusely patent and smooth. Skeleton: Usual degenerative changes. Osteopenia. No acute or aggressive finding. Other neck: 23 mm nodule in the left thyroid. 17 mm nodule in the right thyroid. These are likely incidental given patient's condition. The larger is peripherally calcified. Upper chest: Layering pleural effusions reaching the apex, also seen 10/20/2017 by CT. Review of the MIP images confirms the above findings CTA HEAD FINDINGS Anterior circulation: Atherosclerotic plaque on both carotid siphons. The left MCA is occluded at the origin with faint downstream reconstitution. On the right side that shows acute infarct there is no proximal stenosis or occlusion. Posterior circulation: Codominant vertebral arteries. Vertebrobasilar arteries are smooth and diffusely patent. Symmetric and good flow in bilateral posterior cerebral arteries. Venous sinuses: Patent Anatomic variants: None unusual Delayed phase: No abnormal intracranial enhancement. These results were called by telephone at the time of interpretation on 11/02/2017 at 1:27 pm to Dr. Loney Hering , who verbally acknowledged these results. Review of the MIP images confirms the above findings IMPRESSION: 1.  Moderate to large right frontal acute infarct. There is likely small volume superficial/petechial hemorrhage.No flow limiting stenosis or embolic source seen in the right anterior circulation. 2. Left MCA origin occlusion with limited branch opacification in the arterial phase. No visible left hemispheric infarct, this is an age indeterminate finding. 3. 60% atheromatous left ICA bulb narrowing. 4. Remote right parietal and left lateral lenticulostriate infarcts. 5. Layering pleural effusions also seen 10/20/2017. Electronically Signed   By: Monte Fantasia M.D.   On: 11/02/2017 13:31   Dg Chest 1 View  Result Date: 10/20/2017 CLINICAL DATA:  84 year old female with nausea and vomiting. Breast cancer post lumpectomy. Hypertension. Nonsmoker. Initial encounter. EXAM: CHEST 1 VIEW COMPARISON:  04/12/2012. FINDINGS: Pulmonary edema bilateral pleural effusions greater on right. Basal atelectasis suspected. Heart size top-normal. Calcified aorta. No acute osseous abnormality. IMPRESSION: Pulmonary edema with bilateral pleural effusions greater on right. Basilar subsegmental atelectasis. Aortic Atherosclerosis (ICD10-I70.0). Electronically Signed   By: Genia Del M.D.   On: 10/20/2017 07:58   Ct Head Wo Contrast  Result Date: 10/31/2017 CLINICAL DATA:  Altered mental status. EXAM: CT HEAD WITHOUT CONTRAST TECHNIQUE: Contiguous axial images were obtained from the base of the skull through the vertex without intravenous  contrast. COMPARISON:  10/20/2017 FINDINGS: Brain: Old right parietal infarct again noted, unchanged. There is atrophy and chronic small vessel disease changes. No acute intracranial abnormality. Specifically, no hemorrhage, hydrocephalus, mass lesion, acute infarction, or significant intracranial injury. Vascular: No hyperdense vessel or unexpected calcification. Skull: No acute calvarial abnormality. Sinuses/Orbits: Visualized paranasal sinuses and mastoids clear. Orbital soft tissues  unremarkable. Other: None IMPRESSION: No acute intracranial abnormality. Atrophy, chronic microvascular disease. Old right parietal infarct. Electronically Signed   By: Rolm Baptise M.D.   On: 10/31/2017 11:03   Ct Head Wo Contrast  Result Date: 10/20/2017 CLINICAL DATA:  84 year old female with sudden onset of nausea vomiting and weakness. Hypertension. Prior infarct. Initial encounter. EXAM: CT HEAD WITHOUT CONTRAST TECHNIQUE: Contiguous axial images were obtained from the base of the skull through the vertex without intravenous contrast. COMPARISON:  None. FINDINGS: Brain: No intracranial hemorrhage or CT evidence of large acute infarct. Remote posterior right frontal-parietal lobe infarct. Remote left lenticular nucleus/caudate infarct. Moderate chronic microvascular changes. Mild global atrophy. No intracranial mass lesion noted on this unenhanced exam. Prominent dural calcifications greater on left. Vascular: Vascular calcifications Skull: No skull fracture Sinuses/Orbits: Visualized orbital structures and paranasal sinuses unremarkable. Other: Mastoid air cells and middle ear cavities are clear. IMPRESSION: No acute intracranial abnormality. Remote posterior right frontal-parietal lobe infarct. Remote left lenticular nucleus/caudate infarct. Moderate chronic microvascular changes. Electronically Signed   By: Genia Del M.D.   On: 10/20/2017 08:47   Ct Angio Neck W Or Wo Contrast  Result Date: 11/02/2017 CLINICAL DATA:  Stroke follow-up EXAM: CT ANGIOGRAPHY HEAD AND NECK TECHNIQUE: Multidetector CT imaging of the head and neck was performed using the standard protocol during bolus administration of intravenous contrast. Multiplanar CT image reconstructions and MIPs were obtained to evaluate the vascular anatomy. Carotid stenosis measurements (when applicable) are obtained utilizing NASCET criteria, using the distal internal carotid diameter as the denominator. CONTRAST:  24mL ISOVUE-370 IOPAMIDOL  (ISOVUE-370) INJECTION 76% COMPARISON:  Noncontrast head CT from 2 days ago FINDINGS: CT HEAD FINDINGS Brain: There is a moderate to large right lateral frontal infarct with cytotoxic edema pattern. Superficially there is some high density suggesting hemorrhage. Residual visible cortex could contribute to this appearance. There is a remote right parietal cortex infarct that is moderate size. Remote perforator infarct in the left basal ganglia. No hydrocephalus. Vascular: See below Skull: Negative Sinuses: Negative Orbits: Senescent globe calcifications.  No acute finding. Review of the MIP images confirms the above findings CTA NECK FINDINGS Aortic arch: Diffuse atherosclerotic plaque. No dilatation or dissection seen. Three vessel branching. Right carotid system: Atheromatous plaque at the common carotid bifurcation. No flow limiting stenosis or ulceration. Negative for beading or dissection. Left carotid system: Moderate atheromatous plaque at the common carotid bifurcation and ICA bulb. There is milder plaque along the common carotid. Proximal left ICA stenosis measures 60% on coronal reformats. No dissection or ulceration. Vertebral arteries: No proximal subclavian flow limiting stenosis. Codominant vertebral arteries. Mild atherosclerotic plaque at the right vertebral origin. Both vertebral arteries are diffusely patent and smooth. Skeleton: Usual degenerative changes. Osteopenia. No acute or aggressive finding. Other neck: 23 mm nodule in the left thyroid. 17 mm nodule in the right thyroid. These are likely incidental given patient's condition. The larger is peripherally calcified. Upper chest: Layering pleural effusions reaching the apex, also seen 10/20/2017 by CT. Review of the MIP images confirms the above findings CTA HEAD FINDINGS Anterior circulation: Atherosclerotic plaque on both carotid siphons. The left MCA is occluded  at the origin with faint downstream reconstitution. On the right side that shows  acute infarct there is no proximal stenosis or occlusion. Posterior circulation: Codominant vertebral arteries. Vertebrobasilar arteries are smooth and diffusely patent. Symmetric and good flow in bilateral posterior cerebral arteries. Venous sinuses: Patent Anatomic variants: None unusual Delayed phase: No abnormal intracranial enhancement. These results were called by telephone at the time of interpretation on 11/02/2017 at 1:27 pm to Dr. Loney Hering , who verbally acknowledged these results. Review of the MIP images confirms the above findings IMPRESSION: 1. Moderate to large right frontal acute infarct. There is likely small volume superficial/petechial hemorrhage.No flow limiting stenosis or embolic source seen in the right anterior circulation. 2. Left MCA origin occlusion with limited branch opacification in the arterial phase. No visible left hemispheric infarct, this is an age indeterminate finding. 3. 60% atheromatous left ICA bulb narrowing. 4. Remote right parietal and left lateral lenticulostriate infarcts. 5. Layering pleural effusions also seen 10/20/2017. Electronically Signed   By: Monte Fantasia M.D.   On: 11/02/2017 13:31   Ct Chest Wo Contrast  Result Date: 10/20/2017 CLINICAL DATA:  Acute respiratory illness with pleural effusion. EXAM: CT CHEST WITHOUT CONTRAST TECHNIQUE: Multidetector CT imaging of the chest was performed following the standard protocol without IV contrast. COMPARISON:  Chest x-ray from earlier today FINDINGS: Cardiovascular: Biatrial enlargement. No pericardial effusion. Atherosclerotic calcification of the aorta and coronaries. Large main pulmonary artery as seen with pulmonary hypertension, 3.4 cm. Mediastinum/Nodes: Negative for adenopathy. There is a peripherally calcified nodule posteriorly from the left lobe thyroid measuring 2.2 cm. No associated adenopathy. Lungs/Pleura: Small left and more moderate right pleural effusions that are layering. Diffuse  interlobular septal thickening and bronchial wall thickening. Atelectasis in the lower lobes with ground-glass density that could be alveolar edema or incomplete atelectasis. There is a focal ill-defined 2.1 cm dense opacity in the left upper lobe which is different than the other findings. Small subpleural pulmonary nodules bilaterally which will be seen at follow-up. Upper Abdomen: No acute finding. Musculoskeletal: L1 superior endplate fracture with sclerosis and horizontal fracture plane beneath the mildly depressed superior endplate. This is likely not acute but does not appear healed. No retropulsion. IMPRESSION: 1. Moderate right and small left layering pleural effusions with pulmonary edema and lower lobe atelectasis. Biatrial enlargement. 2. 3 cm spiculated opacity in the superior segment left lower lobe which could be incidental neoplasm or pneumonia. Recommend follow-up noncontrast chest CT in 2 to 3 months. 3. L1 compression fracture that is likely subacute and unhealed. No retropulsion. Height loss is mild. 4. Aortic Atherosclerosis (ICD10-I70.0). Electronically Signed   By: Monte Fantasia M.D.   On: 10/20/2017 10:31   US Carotid Bilateral (at [facility] Only)  Result Date: 10/31/2017 CLINICAL DATA:  84 year old female with symptoms of cerebrovascular accident EXAM: BILATERAL CAROTID DUPLEX ULTRASOUND TECHNIQUE: Pearline Cables scale imaging, color Doppler and duplex ultrasound were performed of bilateral carotid and vertebral arteries in the neck. COMPARISON:  None. FINDINGS: Criteria: Quantification of carotid stenosis is based on velocity parameters that correlate the residual internal carotid diameter with NASCET-based stenosis levels, using the diameter of the distal internal carotid lumen as the denominator for stenosis measurement. The following velocity measurements were obtained: RIGHT ICA:  69/13 cm/sec CCA:  96/7 cm/sec SYSTOLIC ICA/CCA RATIO:  1.2 DIASTOLIC ICA/CCA RATIO:  1.5 ECA:  201 cm/sec  LEFT ICA:  146/30 cm/sec CCA:  89/38 cm/sec SYSTOLIC ICA/CCA RATIO:  2.2 DIASTOLIC ICA/CCA RATIO:  5.5 ECA:  73 cm/sec RIGHT CAROTID ARTERY: Heterogeneous atherosclerotic plaque in the distal common carotid artery and proximal internal carotid artery. By peak systolic velocity criteria, the estimated stenosis remains less than 50%. RIGHT VERTEBRAL ARTERY:  Patent with normal antegrade flow. LEFT CAROTID ARTERY: Heterogeneous and irregular atherosclerotic plaque throughout the left common carotid artery extending into the proximal internal carotid artery. By peak systolic velocity criteria, the estimated stenosis falls in the 50-69% range. LEFT VERTEBRAL ARTERY:  Patent with normal antegrade flow. IMPRESSION: 1. Moderate (50-69%) stenosis proximal left internal carotid artery secondary to heterogeneous and irregular atherosclerotic plaque. 2. Mild (1-49%) stenosis proximal right internal carotid artery secondary to heterogenous atherosclerotic plaque. 3. Vertebral arteries are patent with antegrade flow. Signed, Criselda Peaches, MD [facility] Electronically Signed   By: Jacqulynn Cadet M.D.   On: 10/31/2017 14:57   Dg Chest Port 1 View  Result Date: 10/31/2017 CLINICAL DATA:  Atrial fibrillation EXAM: PORTABLE CHEST 1 VIEW COMPARISON:  10/20/2017 FINDINGS: Moderate bilateral pleural effusions, increasing since prior study. Cardiomegaly with bilateral airspace opacities compatible with edema/CHF, worsening. IMPRESSION: Worsening CHF and bilateral effusions. Electronically Signed   By: Rolm Baptise M.D.   On: 10/31/2017 16:51    Assessment & Plan    84 year old female with history of atrial fibrillation treated with antiplatelet therapy alone secondary to history of falls who was admitted with A. fib with rapid ventricular response and large left MCA CVA.  Patient remains with A. fib with rapid ventricular response despite IV amiodarone drip.   Will give a bolus of 150 mg of amiodarone today to see if this will improve her heart rate.  Rate has remained between 120 and 130.  Patient is hemodynamically stable.  Echo showed preserved LV function EF 55-65%.  Would continue with aspirin rectally and defer chronic anticoagulation.  Signed, Javier Docker Fath MD 11/04/2017, 7:51 AM  Pager: [phone]

## 2017-11-05 DIAGNOSIS — E43 Unspecified severe protein-calorie malnutrition: Secondary | ICD-10-CM

## 2017-11-05 LAB — MAGNESIUM: Magnesium: 1.5 mg/dL — ABNORMAL LOW (ref 1.7–2.4)

## 2017-11-05 LAB — BASIC METABOLIC PANEL
ANION GAP: 11 (ref 5–15)
BUN: 23 mg/dL — ABNORMAL HIGH (ref 6–20)
CO2: 24 mmol/L (ref 22–32)
Calcium: 9.4 mg/dL (ref 8.9–10.3)
Chloride: 105 mmol/L (ref 101–111)
Creatinine, Ser: 0.84 mg/dL (ref 0.44–1.00)
GFR, EST NON AFRICAN AMERICAN: 59 mL/min — AB (ref >60)
Glucose, Bld: 136 mg/dL — ABNORMAL HIGH (ref 65–99)
Potassium: 3.1 mmol/L — ABNORMAL LOW (ref 3.5–5.1)
SODIUM: 140 mmol/L (ref 135–145)

## 2017-11-05 LAB — PHOSPHORUS: PHOSPHORUS: 2.2 mg/dL — AB (ref 2.5–4.6)

## 2017-11-05 MED ORDER — POTASSIUM CHLORIDE IN NACL 20-0.45 MEQ/L-% IV SOLN
INTRAVENOUS | Status: AC
  Administered 2017-11-05: 12:00:00 via INTRAVENOUS
  Filled 2017-11-05 (×2): qty 1000

## 2017-11-05 MED ORDER — MAGNESIUM SULFATE 4 GM/100ML IV SOLN
4.0000 g | Freq: Once | INTRAVENOUS | Status: AC
  Administered 2017-11-05: 4 g via INTRAVENOUS
  Filled 2017-11-05 (×2): qty 100

## 2017-11-05 MED ORDER — POTASSIUM PHOSPHATES 15 MMOLE/5ML IV SOLN
10.0000 mmol | Freq: Once | INTRAVENOUS | Status: AC
  Administered 2017-11-05: 10 mmol via INTRAVENOUS
  Filled 2017-11-05: qty 3.33

## 2017-11-05 MED ORDER — POTASSIUM CHLORIDE 10 MEQ/100ML IV SOLN
10.0000 meq | INTRAVENOUS | Status: AC
  Administered 2017-11-05 (×4): 10 meq via INTRAVENOUS
  Filled 2017-11-05 (×4): qty 100

## 2017-11-05 NOTE — Progress Notes (Signed)
Speech Language Pathology Treatment: Dysphagia  Patient Details Name: Emily Moyer MRN: [id number] DOB: Sep 10, 1924 Today's Date: 11/05/2017 Time: 8527-7824 SLP Time Calculation (min) (ACUTE ONLY): 45 min  Assessment / Plan / Recommendation Clinical Impression  Pt seen for ongoing assessment of toleration of po's in hopes to establish a safe oral diet. Pt given therapeutic trials of po's to both stimulate and engage pt's swallow function. Noted moderate oral weakness on Left side; moderate lingual weakness, and Dysarthria. Pt was awake and alert to her immediate circumstances. Family/friend present sitting w/ pt to provide stimulation. Pt much calmer again today; verbalized often but continues to require min time for processing and follow through.  Pt positioned upright w/ head forward and given 1/2 TSP trials of Honey consistency liquids and applesauce (puree) w/ mod+ verbal/tactile/visual cues to swallow and clear. Pt exhibited both oral phase deficits and pharyngeal phase deficits but w/ time and cues, she demonstrated an adequate swallow to clear the bolus material. When pt attempted talking b/f swallowing, she was educated on NOT talking w/ mouth full. Pt seemed to remember the instruction and did a fair-good job at following through w/ bolus transfer posteriorly and swallowing then clearing. Pt did exhibit min anterior loss of bolus control x1 trial - slow lingual movements overall d/t lingual weakness. It did appear tiring for her; noted min increase in HR; no decline in O2 sats or RR w/ trials. Pt took a few less trials today stating "that's enough". For pt to have opportunity to improve her swallow function, she needs to practice swallowing. Discussed w/ NSG and recommended 3(1/2) TSP trials of puree and Honey liquids each later this PM b/f bedtime in order to exercise the swallowing if agreeable by pt/family and MD/NSG - will await Palliative Care consult results. MUST follow strict  aspiration precautions and check for full swallowing/clearing before giving another bolus. Verbal/tactile/visual cues. IF pt exhibits any increased s/s of aspiration, then stop the po trial exercise and let pt rest. Recommend reducing distractions and talking during the exercise. Consulted w/ NSG who agreed. Nursing order placed in chart. ST services will f/u tomorrow w/ ongoing assessment. Family/pt agreed.   HPI HPI: Pt is a 84 y.o. female with a known history of multiple medical issues including CVA, memory issues, chronic atrial fibrillation only on aspirin due to falls, essential hypertension previous CVA who was actually hospitalized here recently with a UTI and pneumonia who left AGAINST MEDICAL ADVICE recently.  She is brought in by her daughter because she was found sitting on the side of her bed with right-sided gaze deviation, slurred speech and left-sided neglect by her daughter.  Patient does have a slurred speech she wants to go home.  According to her daughter her mother is noncompliant with all her medications other than aspirin. Pt just had a recent admission w/ dx'd pnuemonia and UTI per chart. Unable to obtain MRI due to desaturation and agitation. Pt has been coughing up phlegm per NSG report. HR remains elevated; Cardiology following. Per CT of the head, a moderate to large right frontal acute infarct is noted. Pt does exhibit Left oral weakness; dysarthria.       SLP Plan  Continue with current plan of care(therapeutic trials)       Recommendations  Diet recommendations: (TBD) Liquids provided via: Teaspoon(w/ therapeutic trials w/ NSG/SLP) Medication Administration: Via alternative means Supervision: Full supervision/cueing for compensatory strategies Compensations: Minimize environmental distractions;Slow rate;Small sips/bites;Lingual sweep for clearance of pocketing;Multiple dry swallows after each  bite/sip;Follow solids with liquid(w/ any trials w/ NSG) Postural Changes  and/or Swallow Maneuvers: Seated upright 90 degrees;Upright 30-60 min after meal                General recommendations: (Palliative Care consult) Oral Care Recommendations: Oral care QID;Staff/trained caregiver to provide oral care Follow up Recommendations: Skilled Nursing facility(TBD) SLP Visit Diagnosis: Dysphagia, oropharyngeal phase (R13.12) Plan: Continue with current plan of care(therapeutic trials)       GO                Orinda Kenner, MS, CCC-SLP Watson,Katherine 11/05/2017, 4:16 PM

## 2017-11-05 NOTE — Clinical Social Work Note (Signed)
CSW received consult for patient needing SNF placement, awaiting PT recommendations and family decision on goals of care.  CSW to continue to follow patient's progress throughout discharge planning.  Jones Broom. [place], MSW, [place]  11/05/2017 7:19 PM

## 2017-11-05 NOTE — Progress Notes (Signed)
Initial Nutrition Assessment  DOCUMENTATION CODES:   Severe malnutrition in context of chronic illness  INTERVENTION:  Patient is currently day 5 of NPO status. Daughter has not come in yet to discuss goals of care.  In setting of patient's advanced age, severe malnutrition at baseline, history of noncompliance, and current agitation/confusion requiring mitts, patient is not an ideal candidate for tube feeding. She would likely be very irritated by an NGT/Dobbhoff tube and try to pull it out and may also try to pull out a PEG tube. If she were to discharge home with a PEG it is questionable if she would even be able to be compliant with her tube feedings unless family would be able to assist her.  NUTRITION DIAGNOSIS:   Severe Malnutrition related to chronic illness(hx CVA, advanced age) as evidenced by moderate fat depletion, severe fat depletion, moderate muscle depletion, severe muscle depletion.  GOAL:   Patient will meet greater than or equal to 90% of their needs  MONITOR:   Diet advancement, Labs, Weight trends, Skin, I & O's  REASON FOR ASSESSMENT:   Low Braden, NPO/Clear Liquid Diet    ASSESSMENT:   84 year old female with PMHx of HTN, arthritis, hx CVA, hx invasive mammary carcinoma s/p lumpectomy, vitamin B12 deficiency recently hospitalized with UTI and PNA and left AMA who presented with right-sided gaze deviation, slurred speech, left-sided neglect found to have acute CVA.   -SLP has assessed patient twice. Recommends patient remain NPO. Okay for nursing to provide supervised trials of 1/2 tsp puree and honey liquids. -Per chart patient's daughter has not been able to come in to discuss goals of care. She does not want to have discussions over the phone.  Met with patient at bedside. No family members present. Patient reports that PTA she believes she was eating well. She cannot provide any details on intake. She also does not know her weight history. Weights in chart  do not seem to be accurate so difficult to trend.   Medications reviewed and include: 1/2NS with KCl 20 mEq/L at 75 mL/hr (90 grams dextrose, 306 kcal daily), amiodarone gtt, diltiazem gtt.  Labs reviewed: Potassium 3.1, BUN 23, Phosphorus 2.2.  Discussed with RN and on rounds. Also discussed patient with PMT NP.  NUTRITION - FOCUSED PHYSICAL EXAM:    Most Recent Value  Orbital Region  Severe depletion  Upper Arm Region  Moderate depletion  Thoracic and Lumbar Region  Moderate depletion  Buccal Region  Severe depletion  Temple Region  Severe depletion  Clavicle Bone Region  Severe depletion  Clavicle and Acromion Bone Region  Severe depletion  Scapular Bone Region  Severe depletion  Dorsal Hand  Severe depletion  Patellar Region  Moderate depletion  Anterior Thigh Region  Moderate depletion  Posterior Calf Region  Moderate depletion  Edema (RD Assessment)  Mild  Hair  Reviewed  Eyes  Reviewed  Mouth  Reviewed [poor dentition]  Skin  Reviewed  Nails  Reviewed     Diet Order:  Diet NPO time specified Fall precautions Aspiration precautions  EDUCATION NEEDS:   Not appropriate for education at this time  Skin:  Skin Assessment: Reviewed RN Assessment(abrasions to bilateral legs)  Last BM:  Unknown  Height:   Ht Readings from Last 1 Encounters:  10/20/17 5' 6" (1.676 m)    Weight:   Wt Readings from Last 1 Encounters:  11/03/17 140 lb 14 oz (63.9 kg)    Ideal Body Weight:  59.1 kg    BMI:  Body mass index is 22.74 kg/m.  Estimated Nutritional Needs:   Kcal:  1600-1800 (25-28 kcal/kg)  Protein:  80-95 grams (1.3-1.5 grams/kg)  Fluid:  1.6 L/day (25 mL/kg)   Stephens, MS, RD, LDN Office: [phone] Pager: [phone] After Hours/Weekend Pager: [phone]  

## 2017-11-05 NOTE — Evaluation (Signed)
Physical Therapy Evaluation Patient Details Name: Emily Moyer MRN: [id number] DOB: 11/15/1923 Today's Date: 11/05/2017   History of Present Illness  Pt is a 84 y.o. female with a known history of multiple medical issues including CVA, memory issues, chronic atrial fibrillation only on aspirin due to falls, essential hypertension previous CVA who was actually hospitalized here recently with a UTI and pneumonia who left [place] recently.  She was brought in by her daughter because she was found sitting on the side of her bed with right-sided gaze deviation, slurred speech and left-sided neglect by her daughter.  According to her daughter her mother is noncompliant with all her medications other than aspirin. Unable to obtain MRI due to desaturation and agitation. Pt has been coughing up phlegm per NSG report. HR remains elevated; Cardiology following. Per CT of the head, a moderate to large right frontal acute infarct is noted. Pt exhibit Left oral weakness; dysarthria.  Clinical Impression  Patient alert and oriented to self, location; follows simple verbal commands with increased time for processing.  Mild strength deficits to L hemi-body with suspected sensory deficit (though able to detect deep pressure/pain); moderate R gaze preference and L inattention noted throughout session.  Currently requiring min assist for bed mobility (esp with rolling towards L); unsafe/unable to attempt OOB due to volatile HR throughout session. Resting HR noted 110-120; intermittent elevation to 140s with minimal activity requiring frequent rest periods for recovery to baseline.  Will continue to monitor and assess/progress mobility as appropriate. Would benefit from skilled PT to address above deficits and promote optimal return to PLOF; recommend transition to STR upon discharge from acute hospitalization.     Follow Up Recommendations SNF    Equipment Recommendations       Recommendations  for Other Services       Precautions / Restrictions Precautions Precautions: Fall Precaution Comments: monitor heart rate; NPO Restrictions Weight Bearing Restrictions: No      Mobility  Bed Mobility Overal bed mobility: Needs Assistance Bed Mobility: Rolling Rolling: Min assist         General bed mobility comments: rolling side to side with verbal cues to initiate, particular for rolling to L side  Transfers                 General transfer comment: unsafe/unable  Ambulation/Gait             General Gait Details: unsafe/unable  Stairs            Wheelchair Mobility    Modified Rankin (Stroke Patients Only)       Balance Overall balance assessment: History of Falls(unsafe/unable)                                           Pertinent Vitals/Pain Pain Assessment: Faces Faces Pain Scale: No hurt    Home Living Family/patient expects to be discharged to:: Private residence Living Arrangements: Children Available Help at Discharge: Family Type of Home: House Home Access: Stairs to enter Entrance Stairs-Rails: Left;Right;Can reach both Technical brewer of Steps: 4 Home Layout: One level Home Equipment: Environmental consultant - 2 wheels Additional Comments: patient poor historian, unable to provide accurate PLOF; home set up and PLOF information obtained by chart review from previous recent encounter    Prior Function Level of Independence: Independent with assistive device(s)  Comments: Pt using a 2WW for mobility, indep with basic ADL, med mgt (uses pill organizer; per dtr from chart review, pt noncompliant with medications), and light meal prep. Pt does NOT drive, dtr takes her whereever she needs to go.     Hand Dominance   Dominant Hand: Right    Extremity/Trunk Assessment   Upper Extremity Assessment RUE Deficits / Details: grossly at least 4/5-per OT LUE Deficits / Details: grossly at least 4-/5, decreased  light touch, responds to pain, mildly impaired finger to nose coordination, L elbow noted to be edematous (possibly from IV infiltration?)-per OT    Lower Extremity Assessment Lower Extremity Assessment: (bilat LEs grossly at least 3+ to 4-/5 throughout; suspect decreased sensory awareness/attention to L LE.  Negative clonus, babinski bilat)    Cervical / Trunk Assessment Cervical / Trunk Assessment: (maintains head in R cervical rotation with gaze preference to R (can correct with vebal cuing, but absent spontaneous attempts to correct))  Communication   Communication: Expressive difficulties(dysarthric, slurred speech)  Cognition Arousal/Alertness: Awake/alert Behavior During Therapy: WFL for tasks assessed/performed Overall Cognitive Status: History of cognitive impairments - at baseline                                 General Comments: Pt alert and oriented to self, place. Follows simple commands with additional time to process and verbal cues for tasks on L side.       General Comments      Exercises     Assessment/Plan    PT Assessment Patient needs continued PT services  PT Problem List Decreased strength;Decreased range of motion;Decreased activity tolerance;Decreased balance;Decreased mobility;Decreased coordination;Decreased cognition;Decreased knowledge of use of DME;Decreased safety awareness;Decreased knowledge of precautions;Impaired sensation;Cardiopulmonary status limiting activity       PT Treatment Interventions DME instruction;Stair training;Balance training;Therapeutic activities;Cognitive remediation;Gait training;Functional mobility training;Therapeutic exercise;Neuromuscular re-education;Patient/family education    PT Goals (Current goals can be found in the Care Plan section)  Acute Rehab PT Goals Patient Stated Goal: get better and go home PT Goal Formulation: With patient Time For Goal Achievement: 11/19/17 Potential to Achieve Goals:  Fair Additional Goals Additional Goal #1: Assess and establish goals for OOB activities as appropriate    Frequency Min 2X/week(will plan to increase to QD as HR control improved and patient appropriate for more progressive mobility assessment/training)   Barriers to discharge Decreased caregiver support      Co-evaluation   Reason for Co-Treatment: Necessary to address cognition/behavior during functional activity;Complexity of the patient's impairments (multi-system involvement);To address functional/ADL transfers PT goals addressed during session: Mobility/safety with mobility;Strengthening/ROM OT goals addressed during session: ADL's and self-care;Strengthening/ROM       AM-PAC PT "6 Clicks" Daily Activity  Outcome Measure Difficulty turning over in bed (including adjusting bedclothes, sheets and blankets)?: Unable Difficulty moving from lying on back to sitting on the side of the bed? : Unable Difficulty sitting down on and standing up from a chair with arms (e.g., wheelchair, bedside commode, etc,.)?: Unable Help needed moving to and from a bed to chair (including a wheelchair)?: Total Help needed walking in hospital room?: Total Help needed climbing 3-5 steps with a railing? : Total 6 Click Score: 6    End of Session   Activity Tolerance: Treatment limited secondary to medical complications (Comment)(OOB deferred secondary to HR response to activity) Patient left: in bed;with bed alarm set;with call bell/phone within reach(bilat mittens donned) Nurse Communication: Mobility  status PT Visit Diagnosis: Difficulty in walking, not elsewhere classified (R26.2);Hemiplegia and hemiparesis;Muscle weakness (generalized) (M62.81) Hemiplegia - Right/Left: Left Hemiplegia - dominant/non-dominant: Non-dominant Hemiplegia - caused by: Cerebral infarction    Time: 1420-1447 PT Time Calculation (min) (ACUTE ONLY): 27 min   Charges:   PT Evaluation $PT Eval High Complexity: 1 High      PT G Codes:       Tarini Carrier H. Owens Shark, PT, DPT, NCS 11/05/17, 10:05 PM [phone]

## 2017-11-05 NOTE — Progress Notes (Signed)
Subjective: Patient awake and alert.  NPO.  Decision not made by patient and family concerning feeding.    Objective: Current vital signs: BP 137/78   Pulse (!) 127   Temp (!) 97.5 F (36.4 C) (Oral)   Resp (!) 21   Wt 63.9 kg (140 lb 14 oz)   SpO2 96%   BMI 22.74 kg/m  Vital signs in last 24 hours: Temp:  [97 F (36.1 C)-98.2 F (36.8 C)] 97.5 F (36.4 C) (02/14 0754) Pulse Rate:  [98-150] 127 (02/14 0700) Resp:  [13-29] 21 (02/14 0700) BP: (85-155)/(58-93) 137/78 (02/14 0700) SpO2:  [92 %-100 %] 96 % (02/14 0700)  Intake/Output from previous day: 02/13 0701 - 02/14 0700 In: 1171.2 [I.V.:1171.2] Out: 560 [Urine:560] Intake/Output this shift: Total I/O In: 148.3 [I.V.:48.3; IV Piggyback:100] Out: -  Nutritional status: Diet NPO time specified Fall precautions Aspiration precautions  Neurologic Exam: Mental Status: Dysarthricbut able to respond to questioning.  Fluent. Follows commands. Alert.  Cranial Nerves: II: Blinks to confrontation bilaterally. III,IV, VI: ptosis not present,EOMI. V,VII: L facial droop VIII: hearing normal bilaterally IX,X: gag reflexreduced UD:JSHFWY to test XII: midline tongue extension Motor: Patient able to squeeze my hand bilaterally.   Lifts all extremities against gravity.   Lab Results: Basic Metabolic Panel: Recent Labs  Lab 11/01/17 0524 11/02/17 0516 11/03/17 0532 11/04/17 0530 11/05/17 0511  NA 139 137 138 138 140  K 3.8 3.2* 3.4* 4.0 3.1*  CL 101 99* 102 103 105  CO2 22 24 23  21* 24  GLUCOSE 139* 135* 134* 125* 136*  BUN 29* 29* 29* 26* 23*  CREATININE 1.32* 1.33* 1.04* 0.89 0.84  CALCIUM 10.0 9.8 9.8 9.6 9.4  MG 1.5* 2.2 2.0 1.8 1.5*  PHOS 3.4 3.0  --   --  2.2*    Liver Function Tests: Recent Labs  Lab 10/31/17 1036  AST 46*  ALT 49  ALKPHOS 135*  BILITOT 1.0  PROT 6.9  ALBUMIN 3.5   No results for input(s): LIPASE, AMYLASE in the last 168 hours. No results for input(s): AMMONIA in the  last 168 hours.  CBC: Recent Labs  Lab 10/31/17 1036 11/01/17 0524  WBC 13.0* 15.3*  HGB 12.3 12.4  HCT 37.9 37.9  MCV 86.0 85.9  PLT 328 333    Cardiac Enzymes: Recent Labs  Lab 10/31/17 1036 10/31/17 2007  TROPONINI <0.03 <0.03    Lipid Panel: Recent Labs  Lab 10/31/17 2007  CHOL 129  TRIG 74  HDL 39*  CHOLHDL 3.3  VLDL 15  LDLCALC 75    CBG: Recent Labs  Lab 10/31/17 1826  GLUCAP 135*    Microbiology: Results for orders placed or performed during the hospital encounter of 10/31/17  MRSA PCR Screening     Status: None   Collection Time: 11/01/17 12:05 AM  Result Value Ref Range Status   MRSA by PCR NEGATIVE NEGATIVE Final    Comment:        The GeneXpert MRSA Assay (FDA approved for NASAL specimens only), is one component of a comprehensive MRSA colonization surveillance program. It is not intended to diagnose MRSA infection nor to guide or monitor treatment for MRSA infections. Performed at [facility], [address] [place], [place] [address]     Coagulation Studies: No results for input(s): LABPROT, INR in the last 72 hours.  Imaging: No results found.  Medications:  I have reviewed the patient's current medications. Scheduled: .  stroke: mapping our early stages of recovery  book   Does not apply Once  . chlorhexidine  15 mL Mouth Rinse BID  . mouth rinse  15 mL Mouth Rinse q12n4p    Assessment/Plan: Patient with some improvement on neurological exam.  Therapy not working with patient currently due to elevated HR.  Discussion had with patient concerning feeding this morning and patient would like some time to think about it.  Having trouble reaching family since they are having transportation issues and will only discuss this in person per chart.   Recommendations: 1.  Continue rectal ASA.  Patient not an anticoagulation candidate due to issues with compliance and falls. 2.  Continue therapy once medically stable.      LOS: 5 days   Alexis Goodell, MD Neurology [phone] 11/05/2017  11:59 AM

## 2017-11-05 NOTE — Progress Notes (Addendum)
Follow up - Critical Care Medicine Note  Patient Details:    Emily Moyer is an 84 y.o. female.  Emily Moyer is a 84 year old female with a past medical history remarkable for hypertension, diabetes, neuropathy, CVA, breast carcinoma, atrial fibrillation on aspirin due to falls,receently hospitalized here recently with a UTI and pneumonia who left AGAINST MEDICAL ADVICE recently. She is brought in by her daughter because she was found sitting on the side of her bed with right-sided gaze deviation, slurred speech and left-sided neglect by her daughter.    CC follow up mental status changes  HPI More alert today Patient is DNR/DNI afib not controlled on amiodarone infusion   CT HEAD 1. Moderate to large right frontal acute infarct. There is likely small volume superficial/petechial hemorrhage.No flow limiting stenosis or embolic source seen in the right anterior circulation. 2. Left MCA origin occlusion with limited branch opacification in the arterial phase. No visible left hemispheric infarct, this is an age indeterminate finding. 3. 60% atheromatous left ICA bulb narrowing. 4. Remote right parietal and left lateral lenticulostriate infarcts.     Consults: Treatment Team:  Teodoro Spray, MD Clovis Fredrickson, MD Leotis Pain, MD Clapacs, Madie Reno, MD    Objective:  Vital signs for last 24 hours: Temp:  [97 F (36.1 C)-97.5 F (36.4 C)] 97.5 F (36.4 C) (02/14 0754) Pulse Rate:  [98-151] 125 (02/14 1200) Resp:  [13-29] 23 (02/14 1200) BP: (115-155)/(58-93) 134/85 (02/14 1200) SpO2:  [92 %-98 %] 97 % (02/14 1200)    Intake/Output from previous day: 02/13 0701 - 02/14 0700 In: 1171.2 [I.V.:1171.2] Out: 560 [Urine:560]  Intake/Output this shift: Total I/O In: 148.3 [I.V.:48.3; IV Piggyback:100] Out: -    Review of Systems: No pain Other ROS limited  Physical Exam:  Patient stuperous  HEENT: No respiratory distress, trachea is  midline, left-sided facial droop Cardiovascular: Patient with atrial fibrillation, irregularly irregular rhythm, ventricular responses in the 118-  140's Pulmonary: Scant basilar crackles appreciated right greater than left Abdominal: Positive bowel sounds, soft exam Extremities: No clubbing cyanosis or edema noted Neurologic: Patient with right-sided gaze, left-sided neglect with facial asymmetry does not remove all extremities   Assessment/Plan:  84 yo female with history of afib not anticoagulated due to comorbidity and fall history who was admitted after being noted to have right sided gaze preference and left sided neglect c/w with acute cva.     1. CVA  Will need to update family and address goals of care. Neurology on consult.  2. Congestive heart failure with no current distress.  Presently on nasal cannula   3. Atrial fibrillation with not well controlled ventricular rate.  Patient on a amiodarone and Diltiazem infusion-follow up cardiology. She appears clinically dry.  Will give her 1 liter of IVF to see if her heart rate will improve.  She is not getting oral nutrition and will likely need a PEG.  4 Hypertension. Blood pressure stable as morning  Leukocytosis. Leukocytosis of 15.3. No clear evidence of infection noted- will monitor.  Renal insufficiency- improving.  Will follow  Plan:  1. Social service consult to address home care needs 2. Judicious IVF to treat hypovolemia.  Will need PEG tube for long term nutrition.  Will discuss with family.  Family to show up after 5pm. 3. Will monitor renal function. 4. PT/  Overall prognosis is poor Recommend palliative care consult and hospice  Transfer to floor when HR stable  Patient's daughter arrived and was updated.  She will decide on PEG tube placement by tomorrow.   Cammie Sickle, M.D.  [facility]

## 2017-11-05 NOTE — Progress Notes (Signed)
Pharmacy Electrolyte Monitoring Consult:  Pharmacy consulted to assist in monitoring and replacing electrolytes in this 84 y.o. female admitted on 10/31/2017 with Altered Mental Status   Labs:  Sodium (mmol/L)  Date Value  11/05/2017 140   Potassium (mmol/L)  Date Value  11/05/2017 3.1 (L)   Magnesium (mg/dL)  Date Value  11/05/2017 1.5 (L)   Phosphorus (mg/dL)  Date Value  11/05/2017 2.2 (L)   Calcium (mg/dL)  Date Value  11/05/2017 9.4   Albumin (g/dL)  Date Value  10/31/2017 3.5    Plan: Patient received potassium 34mEq IV Q1hr x 4 doses this am. Patient ordered 1/2NS + 47mEq off potassium/L x 1 L. Will order additional magnesium 4mg  IV x 1 and potassium phosphate 23mmol IV x 1.   Will recheck electrolytes with am labs.   Pharmacy will continue to monitor and adjust per consult.   Blu Lori L 11/05/2017 8:40 PM

## 2017-11-05 NOTE — Evaluation (Signed)
Occupational Therapy Evaluation Patient Details Name: Emily Moyer MRN: [id number] DOB: [birth year] Today's Date: 11/05/2017    History of Present Illness Pt is a 84 y.o. female with a known history of multiple medical issues including CVA, memory issues, chronic atrial fibrillation only on aspirin due to falls, essential hypertension previous CVA who was actually hospitalized here recently with a UTI and pneumonia who left [place] recently.  She was brought in by her daughter because she was found sitting on the side of her bed with right-sided gaze deviation, slurred speech and left-sided neglect by her daughter.  According to her daughter her mother is noncompliant with all her medications other than aspirin. Unable to obtain MRI due to desaturation and agitation. Pt has been coughing up phlegm per NSG report. HR remains elevated; Cardiology following. Per CT of the head, a moderate to large right frontal acute infarct is noted. Pt exhibit Left oral weakness; dysarthria.   Clinical Impression   Pt seen for OT/PT co-evaluation this date, due to pt's anticipated functional skills and assist likely required for mobility tasks this date. RN cleared for PT/OT to work with pt this date, pt still on Cardizem drip and amiodarone. Pt with recent admission, per chart review, pt living with daughter and generally independent with basic ADL and using 2WW for mobility prior to recent admission. Pt alert and oriented to self and place, agreeable to working with therapy. Pt answering questions appropriately, sometimes difficult to understand due to slurred speech. Lateral tongue deviation to L side noted. Pt able to follow simple commands with additional verbal cues, particularly to L side. R gaze preference, although with verbal cues will turn head and track to L side. Pt able to lift all limbs off the bed, L side weaker than R, impaired light touch to L side but does respond to noxious stimuli,  impaired coordination in L side. Pt able to participate rolling side to side with min assist for peri-hygiene after small BM. Further mobility deferred due to elevated HR. BP at end of session 120/104, HR 102-143 during session, and O2 on 2L >95% throughout. Pt will benefit from skilled OT services to address noted impairments and functional deficits in order to maximize return to PLOF and minimize risk of functional decline, increased caregiver burden, or need for long term higher level of care. Recommend transition to STR following hospitalization pending pt's progress.    Follow Up Recommendations  SNF    Equipment Recommendations  Other (comment)(TBD)    Recommendations for Other Services       Precautions / Restrictions Precautions Precautions: Fall Precaution Comments: monitor heart rate Restrictions Weight Bearing Restrictions: No      Mobility Bed Mobility Overal bed mobility: Needs Assistance Bed Mobility: Rolling Rolling: Min assist         General bed mobility comments: rolling side to side with verbal cues to initiate, particular for rolling to L side  Transfers                 General transfer comment: unable to attempt due to HR    Balance Overall balance assessment: History of Falls                                         ADL either performed or assessed with clinical judgement   ADL Overall ADL's : Needs assistance/impaired Eating/Feeding: NPO Eating/Feeding  Details (indicate cue type and reason): NPO, pending daughter's convo w/ MD regarding feeding tube Grooming: Bed level;Set up;Min guard   Upper Body Bathing: Bed level;Minimal assistance;Moderate assistance   Lower Body Bathing: Bed level;Maximal assistance   Upper Body Dressing : Bed level;Minimal assistance;Moderate assistance   Lower Body Dressing: Bed level;Maximal assistance     Toilet Transfer Details (indicate cue type and reason): pt participated in rolling  side to side with min assist  Toileting- Clothing Manipulation and Hygiene: Bed level;Maximal assistance Toileting - Clothing Manipulation Details (indicate cue type and reason): pt max assist from bed level for peri hygiene after BM             Vision Baseline Vision/History: No visual deficits Patient Visual Report: No change from baseline Vision Assessment?: Yes Eye Alignment: Within Functional Limits Ocular Range of Motion: Impaired-to be further tested in functional context;Restricted on the left Alignment/Gaze Preference: Within Defined Limits Tracking/Visual Pursuits: Requires cues, head turns, or add eye shifts to track(VC to turn head and eyes towards L past midline) Depth Perception: Overshoots     Perception     Praxis      Pertinent Vitals/Pain Pain Assessment: No/denies pain     Hand Dominance Right   Extremity/Trunk Assessment Upper Extremity Assessment Upper Extremity Assessment: RUE deficits/detail;LUE deficits/detail RUE Deficits / Details: grossly at least 4/5 LUE Deficits / Details: grossly at least 4-/5, decreased light touch, responds to pain, mildly impaired finger to nose coordination, L elbow noted to be edematous (possibly from IV infiltration?) LUE Sensation: decreased light touch LUE Coordination: decreased fine motor   Lower Extremity Assessment Lower Extremity Assessment: Defer to PT evaluation;RLE deficits/detail;LLE deficits/detail RLE Deficits / Details: grossly at least 3+/5, intact sensation LLE Deficits / Details: grossly at least 3-/5, decreased light touch, responds to pain LLE Sensation: decreased light touch LLE Coordination: decreased gross motor       Communication Communication Communication: Expressive difficulties(slurred speech)   Cognition Arousal/Alertness: Awake/alert Behavior During Therapy: WFL for tasks assessed/performed Overall Cognitive Status: History of cognitive impairments - at baseline                                  General Comments: Pt alert and oriented to self, place. Follows simple commands with additional time to process and verbal cues for tasks on L side.    General Comments  urinary catheter in place, mits on hands at start and at end of session    Exercises     Shoulder Instructions      Home Living Family/patient expects to be discharged to:: Private residence Living Arrangements: Children(daughter) Available Help at Discharge: Family Type of Home: House Home Access: Stairs to enter Technical brewer of Steps: 4 Entrance Stairs-Rails: Left;Right;Can reach both [place]: One [place]: Environmental consultant - 2 wheels   Additional Comments: home set up and PLOF information obtained by chart review from previous recent encounter      Prior Functioning/Environment Level of Independence: Independent with assistive device(s)        Comments: Pt using a 2WW for mobility, indep with basic ADL, med mgt (uses pill organizer; per dtr from chart review, pt noncompliant with medications), and light meal prep. Pt does NOT drive, dtr takes her whereever she needs to go.        OT Problem List: Decreased  strength;Impaired vision/perception;Decreased knowledge of use of DME or AE;Increased edema;Decreased coordination;Decreased activity tolerance;Decreased cognition;Cardiopulmonary status limiting activity;Impaired UE functional use;Impaired sensation;Impaired balance (sitting and/or standing)      OT Treatment/Interventions: Self-care/ADL training;Balance training;Therapeutic exercise;Neuromuscular education;Therapeutic activities;Energy conservation;DME and/or AE instruction;Cognitive remediation/compensation;Visual/perceptual remediation/compensation;Patient/family education    OT Goals(Current goals can be found in the care plan section) Acute Rehab OT Goals Patient Stated Goal: get better and go home OT Goal Formulation: With patient Time  For Goal Achievement: 11/19/17 Potential to Achieve Goals: Good ADL Goals Pt Will Perform Grooming: with min guard assist;sitting Pt Will Perform Upper Body Dressing: with min assist;sitting Pt Will Perform Lower Body Dressing: with min assist;with mod assist;sit to/from stand Pt Will Transfer to Toilet: with min assist;bedside commode;ambulating;with mod assist(LRAD for ambulation)  OT Frequency: Min 2X/week   Barriers to D/C:            Co-evaluation PT/OT/SLP Co-Evaluation/Treatment: Yes Reason for Co-Treatment: Necessary to address cognition/behavior during functional activity;For patient/therapist safety;To address functional/ADL transfers PT goals addressed during session: Mobility/safety with mobility;Strengthening/ROM OT goals addressed during session: ADL's and self-care;Strengthening/ROM      AM-PAC PT "6 Clicks" Daily Activity     Outcome Measure Help from another person eating meals?: Total(NPO) Help from another person taking care of personal grooming?: A Little Help from another person toileting, which includes using toliet, bedpan, or urinal?: A Lot Help from another person bathing (including washing, rinsing, drying)?: A Lot Help from another person to put on and taking off regular upper body clothing?: A Lot Help from another person to put on and taking off regular lower body clothing?: A Lot 6 Click Score: 12   End of Session    Activity Tolerance: Patient tolerated treatment well Patient left: in bed;with call bell/phone within reach;with bed alarm set;with restraints reapplied  OT Visit Diagnosis: Other abnormalities of gait and mobility (R26.89);History of falling (Z91.81);Muscle weakness (generalized) (M62.81);Hemiplegia and hemiparesis;Other symptoms and signs involving cognitive function Hemiplegia - Right/Left: Left Hemiplegia - dominant/non-dominant: Non-Dominant Hemiplegia - caused by: Cerebral infarction                Time: 1420-1447 OT Time  Calculation (min): 27 min Charges:  OT General Charges $OT Visit: 1 Visit OT Evaluation $OT Eval Moderate Complexity: 1 Mod  Jeni Salles, MPH, MS, OTR/L ascom [phone] 11/05/17, 3:40 PM

## 2017-11-05 NOTE — Progress Notes (Signed)
[facility] at [place] NAME: Emily Moyer    MR#:  [id number]  DATE OF BIRTH:  02-02-1924  SUBJECTIVE:  CHIEF COMPLAINT:   Chief Complaint  Patient presents with  . Altered Mental Status  same, waiting for family decision REVIEW OF SYSTEMS:   ROS: unable to be obtained due to encephalopathy  DRUG ALLERGIES:   Allergies  Allergen Reactions  . Aspirin-Dipyridamole Er     Other reaction(s): Unknown  . Dipyridamole     Other reaction(s): Unknown  . Lyrica [Pregabalin] Swelling  . Clonidine Other (See Comments)    Dry mouth  . Cymbalta  [Duloxetine Hcl]     Other reaction(s): Other (See Comments) insomnia  . Duloxetine     Other reaction(s): Other (See Comments) insomnia    VITALS:  Blood pressure 96/62, pulse (!) 138, temperature 98.2 F (36.8 C), temperature source Oral, resp. rate (!) 38, weight 63.9 kg (140 lb 14 oz), SpO2 95 %.  PHYSICAL EXAMINATION:  GENERAL:  84 y.o.-year-old patient lying in the bed with no acute distress.  EYES: Pupils equal, round, reactive to light and accommodation. No scleral icterus. Extraocular muscles intact.  HEENT: Head atraumatic, normocephalic. Oropharynx and nasopharynx clear.  NECK:  Supple, no jugular venous distention. No thyroid enlargement, no tenderness.  LUNGS: Normal breath sounds bilaterally, no wheezing, rales,rhonchi or crepitation. No use of accessory muscles of respiration.  CARDIOVASCULAR: S1, S2 normal. No murmurs, rubs, or gallops.  ABDOMEN: Soft, nontender, nondistended. Bowel sounds present. No organomegaly or mass.  EXTREMITIES: No pedal edema, cyanosis, or clubbing.  NEUROLOGIC: Cranial nerves II through XII are intact. Muscle strength 5/5 in all extremities. Sensation intact. Gait not checked.  PSYCHIATRIC: The patient is lethargic and confused SKIN: No obvious rash, lesion, or ulcer.   Physical Exam LABORATORY PANEL:   CBC Recent Labs  Lab 11/01/17 0524  WBC  15.3*  HGB 12.4  HCT 37.9  PLT 333   ------------------------------------------------------------------------------------------------------------------  Chemistries  Recent Labs  Lab 10/31/17 1036  11/05/17 0511  NA 136   < > 140  K 3.7   < > 3.1*  CL 99*   < > 105  CO2 23   < > 24  GLUCOSE 147*   < > 136*  BUN 29*   < > 23*  CREATININE 1.53*   < > 0.84  CALCIUM 9.6   < > 9.4  MG  --    < > 1.5*  AST 46*  --   --   ALT 49  --   --   ALKPHOS 135*  --   --   BILITOT 1.0  --   --    < > = values in this interval not displayed.   ------------------------------------------------------------------------------------------------------------------  Cardiac Enzymes Recent Labs  Lab 10/31/17 1036 10/31/17 2007  TROPONINI <0.03 <0.03   ------------------------------------------------------------------------------------------------------------------  RADIOLOGY:  No results found.  ASSESSMENT AND PLAN:  Patient is a 84 year old white female with history of atrial fibrillation presenting with strokelike symptoms  1.Acute large right frontal lobe infarct  CT angiogram of the head and neck noted for large right frontal lobe infarct and occlusion of left MCA in discussion with radiology, continue CVA protocol, echocardiogram noted for ejection fraction 55-65%, patient is not a good candidate for anticoagulation-cardiology agrees -neurology input greatly appreciated-aspirin daily  2.Acute A. fib with RVR - on amiodarone and Cardizem drips Cardiology following, patient is a poor candidate for anticoagulation Echocardiogram noted above  3.Essential  hypertension, chronic Stable  4.Medical noncompliance Noted previous admission patient leftAMA DOSinvolved, psychiatry did see patient while in house - pt is non-verbal/refusing to take meds  5.Chronic noncompliance of medical management Stable  DNR status Long-term prognosis is dismal  Disposition to  nursing home if family in agreement  Family to decide goals of care in conjunction with ICU team  All the records are reviewed and case discussed with Care Management/Social Workerr. Management plans discussed with the patient, nursing and they are in agreement.  CODE STATUS: dnr  TOTAL TIME TAKING CARE OF THIS PATIENT: 15 minutes.       Max Sane M.D on 11/05/2017   Between 7am to 6pm - Pager - [phone]  After 6pm go to [email or web address] - password [email or web address] [facility]  Office  [phone]  CC: Primary care physician; Ezequiel Kayser, MD  Note: This dictation was prepared with Dragon dictation along with smaller phrase technology. Any transcriptional errors that result from this process are unintentional.

## 2017-11-05 NOTE — Progress Notes (Signed)
Dr Celesta Aver spoke with pt's daughter regarding insertion of PEG tube to meet nutritional needs. Pt is not safely taking in enough liquids and foods to sustain her. She also discussed option of not placing a PEG tube and a comfort care scenario. Daughter has good support of lifelong friend to help with decision making. They will discuss today and daughter will give [place] her decision tomorrow.

## 2017-11-06 DIAGNOSIS — Z7189 Other specified counseling: Secondary | ICD-10-CM

## 2017-11-06 DIAGNOSIS — E43 Unspecified severe protein-calorie malnutrition: Secondary | ICD-10-CM

## 2017-11-06 DIAGNOSIS — Z515 Encounter for palliative care: Secondary | ICD-10-CM

## 2017-11-06 DIAGNOSIS — I639 Cerebral infarction, unspecified: Secondary | ICD-10-CM

## 2017-11-06 LAB — CBC WITH DIFFERENTIAL/PLATELET
Basophils Absolute: 0 K/uL (ref 0–0.1)
Basophils Relative: 0 %
Eosinophils Absolute: 0 K/uL (ref 0–0.7)
Eosinophils Relative: 0 %
HCT: 37.8 % (ref 35.0–47.0)
Hemoglobin: 12.3 g/dL (ref 12.0–16.0)
Lymphocytes Relative: 4 %
Lymphs Abs: 0.6 K/uL — ABNORMAL LOW (ref 1.0–3.6)
MCH: 28.2 pg (ref 26.0–34.0)
MCHC: 32.6 g/dL (ref 32.0–36.0)
MCV: 86.3 fL (ref 80.0–100.0)
Monocytes Absolute: 0.9 K/uL (ref 0.2–0.9)
Monocytes Relative: 7 %
Neutro Abs: 11.5 K/uL — ABNORMAL HIGH (ref 1.4–6.5)
Neutrophils Relative %: 89 %
Platelets: 320 K/uL (ref 150–440)
RBC: 4.38 MIL/uL (ref 3.80–5.20)
RDW: 17.3 % — ABNORMAL HIGH (ref 11.5–14.5)
WBC: 13 K/uL — ABNORMAL HIGH (ref 3.6–11.0)

## 2017-11-06 LAB — PHOSPHORUS: Phosphorus: 3 mg/dL (ref 2.5–4.6)

## 2017-11-06 LAB — BASIC METABOLIC PANEL WITH GFR
Anion gap: 14 (ref 5–15)
BUN: 18 mg/dL (ref 6–20)
CO2: 21 mmol/L — ABNORMAL LOW (ref 22–32)
Calcium: 9.3 mg/dL (ref 8.9–10.3)
Chloride: 100 mmol/L — ABNORMAL LOW (ref 101–111)
Creatinine, Ser: 0.88 mg/dL (ref 0.44–1.00)
GFR calc Af Amer: >60 mL/min
GFR calc non Af Amer: 56 mL/min — ABNORMAL LOW
Glucose, Bld: 127 mg/dL — ABNORMAL HIGH (ref 65–99)
Potassium: 3.5 mmol/L (ref 3.5–5.1)
Sodium: 135 mmol/L (ref 135–145)

## 2017-11-06 LAB — MAGNESIUM: Magnesium: 2 mg/dL (ref 1.7–2.4)

## 2017-11-06 MED ORDER — SODIUM CHLORIDE 0.9 % IV BOLUS (SEPSIS)
250.0000 mL | Freq: Once | INTRAVENOUS | Status: AC
  Administered 2017-11-06: 250 mL via INTRAVENOUS

## 2017-11-06 MED ORDER — DEXMEDETOMIDINE HCL IN NACL 400 MCG/100ML IV SOLN
0.4000 ug/kg/h | INTRAVENOUS | Status: DC
  Administered 2017-11-06: 0.4 ug/kg/h via INTRAVENOUS
  Administered 2017-11-07: 0.8 ug/kg/h via INTRAVENOUS
  Filled 2017-11-06 (×2): qty 100

## 2017-11-06 MED ORDER — MAGNESIUM SULFATE 4 GM/100ML IV SOLN: 4.0000 g | Freq: Once | INTRAVENOUS | Status: DC

## 2017-11-06 MED ORDER — POTASSIUM CHLORIDE 10 MEQ/100ML IV SOLN: 10.0000 meq | INTRAVENOUS | Status: DC

## 2017-11-06 MED ORDER — POTASSIUM PHOSPHATES 15 MMOLE/5ML IV SOLN: 30.0000 mmol | Freq: Once | INTRAVENOUS | Status: DC

## 2017-11-06 MED ORDER — POTASSIUM CHLORIDE 10 MEQ/100ML IV SOLN
10.0000 meq | INTRAVENOUS | Status: AC
  Administered 2017-11-06 (×2): 10 meq via INTRAVENOUS
  Filled 2017-11-06 (×3): qty 100

## 2017-11-06 NOTE — Progress Notes (Signed)
[facility] at [place] NAME: Emily Moyer    MR#:  [id number]  DATE OF BIRTH:  02-05-1924  SUBJECTIVE:  CHIEF COMPLAINT:   Chief Complaint  Patient presents with  . Altered Mental Status  Confused, waiting for family decision REVIEW OF SYSTEMS:   ROS: unable to be obtained due to encephalopathy  DRUG ALLERGIES:   Allergies  Allergen Reactions  . Aspirin-Dipyridamole Er     Other reaction(s): Unknown  . Dipyridamole     Other reaction(s): Unknown  . Lyrica [Pregabalin] Swelling  . Clonidine Other (See Comments)    Dry mouth  . Cymbalta  [Duloxetine Hcl]     Other reaction(s): Other (See Comments) insomnia  . Duloxetine     Other reaction(s): Other (See Comments) insomnia    VITALS:  Blood pressure (!) 153/51, pulse 87, temperature (!) 97.4 F (36.3 C), temperature source Axillary, resp. rate (!) 24, weight 63.9 kg (140 lb 14 oz), SpO2 96 %.  PHYSICAL EXAMINATION:  GENERAL:  84 y.o.-year-old patient lying in the bed with no acute distress.  EYES: Pupils equal, round, reactive to light and accommodation. No scleral icterus. Extraocular muscles intact.  HEENT: Head atraumatic, normocephalic. Oropharynx and nasopharynx clear.  NECK:  Supple, no jugular venous distention. No thyroid enlargement, no tenderness.  LUNGS: Normal breath sounds bilaterally, no wheezing, rales,rhonchi or crepitation. No use of accessory muscles of respiration.  CARDIOVASCULAR: S1, S2 normal. No murmurs, rubs, or gallops.  ABDOMEN: Soft, nontender, nondistended. Bowel sounds present. No organomegaly or mass.  EXTREMITIES: No pedal edema, cyanosis, or clubbing.  NEUROLOGIC: Cranial nerves II through XII are intact. Muscle strength 5/5 in all extremities. Sensation intact. Gait not checked.  PSYCHIATRIC: The patient is lethargic and confused SKIN: No obvious rash, lesion, or ulcer.   Physical Exam LABORATORY PANEL:   CBC Recent Labs  Lab  11/06/17 0809  WBC 13.0*  HGB 12.3  HCT 37.8  PLT 320   ------------------------------------------------------------------------------------------------------------------  Chemistries  Recent Labs  Lab 10/31/17 1036  11/06/17 1002  NA 136   < > 135  K 3.7   < > 3.5  CL 99*   < > 100*  CO2 23   < > 21*  GLUCOSE 147*   < > 127*  BUN 29*   < > 18  CREATININE 1.53*   < > 0.88  CALCIUM 9.6   < > 9.3  MG  --    < > 2.0  AST 46*  --   --   ALT 49  --   --   ALKPHOS 135*  --   --   BILITOT 1.0  --   --    < > = values in this interval not displayed.   ------------------------------------------------------------------------------------------------------------------  Cardiac Enzymes Recent Labs  Lab 10/31/17 1036 10/31/17 2007  TROPONINI <0.03 <0.03   ------------------------------------------------------------------------------------------------------------------  RADIOLOGY:  No results found.  ASSESSMENT AND PLAN:  Patient is a 84 year old white female with history of atrial fibrillation presenting with strokelike symptoms  1.Acute large right frontal lobe infarct  CT angiogram of the head and neck noted for large right frontal lobe infarct and occlusion of left MCA in discussion with radiology, continue CVA protocol, echocardiogram noted for ejection fraction 55-65%, patient is not a good candidate for anticoagulation-cardiology agrees -neurology recommends aspirin daily  2.Acute A. fib with RVR - on amiodarone and Cardizem drips Cardiology following, patient is a poor candidate for anticoagulation Echocardiogram noted above  3.Essential  hypertension, chronic Stable  4.Medical noncompliance Noted previous admission patient leftAMA DOSinvolved, psychiatry did see patient while in house - pt is non-verbal/refusing to take meds  5.Chronic noncompliance of medical management Stable  DNR status Long-term prognosis is dismal  Disposition to  LTAC if family in agreement  Family to decide goals of care in conjunction with ICU team  All the records are reviewed and case discussed with Care Management/Social Workerr. Management plans discussed with the patient, nursing and they are in agreement.  CODE STATUS: dnr  TOTAL TIME TAKING CARE OF THIS PATIENT: 10 minutes.       Max Sane M.D on 11/06/2017   Between 7am to 6pm - Pager - [phone]  After 6pm go to [email or web address] - password [email or web address] [facility]  Office  [phone]  CC: Primary care physician; Ezequiel Kayser, MD  Note: This dictation was prepared with Dragon dictation along with smaller phrase technology. Any transcriptional errors that result from this process are unintentional.

## 2017-11-06 NOTE — Progress Notes (Signed)
SLP Cancellation Note  Patient Details Name: ADRAIN BUTRICK MRN: [id number] DOB: [birth year]   Cancelled treatment:       Reason Eval/Treat Not Completed: Patient not medically ready;Medical issues which prohibited therapy(chart reviewed; MD consulted ). During rounds, MD is concerned about increased rhonchi sounds and requested holding on any therapeutic po trials at this time. Recommend ongoing frequent oral care for hygiene and stimulation for swallowing. MD/NSG agreed.  ST services will f/u next week for poc; noted Palliative Care consult note.   Orinda Kenner, MS, CCC-SLP Bryttany Tortorelli 11/06/2017, 1:56 PM

## 2017-11-06 NOTE — Progress Notes (Signed)
Urine output at this time is 75cc.  Maggie, NP notified and a 250cc NS bolus was ordered. Will continue to monitor.

## 2017-11-06 NOTE — Progress Notes (Signed)
Follow up - Critical Care Medicine Note  Patient Details:    Emily Moyer is an 84 y.o. female.  Emily Moyer is a 84 year old female with a past medical history remarkable for hypertension, diabetes, neuropathy, CVA, breast carcinoma, atrial fibrillation on aspirin due to falls,receently hospitalized here recently with a UTI and pneumonia who left AGAINST MEDICAL ADVICE recently. She is brought in by her daughter because she was found sitting on the side of her bed with right-sided gaze deviation, slurred speech and left-sided neglect by her daughter.    CC follow up mental status changes  HPI More alert today Patient is DNR/DNI afib not controlled on amiodarone infusion   CT HEAD 1. Moderate to large right frontal acute infarct. There is likely small volume superficial/petechial hemorrhage.No flow limiting stenosis or embolic source seen in the right anterior circulation. 2. Left MCA origin occlusion with limited branch opacification in the arterial phase. No visible left hemispheric infarct, this is an age indeterminate finding. 3. 60% atheromatous left ICA bulb narrowing. 4. Remote right parietal and left lateral lenticulostriate infarcts.     Consults: Treatment Team:  Teodoro Spray, MD Clovis Fredrickson, MD Leotis Pain, MD Clapacs, Madie Reno, MD    Objective:  Vital signs for last 24 hours: Temp:  [97.4 F (36.3 C)-98.6 F (37 C)] 98.6 F (37 C) (02/15 1600) Pulse Rate:  [93-138] 93 (02/15 1900) Resp:  [16-38] 31 (02/15 1900) BP: (100-141)/(54-100) 117/100 (02/15 1900) SpO2:  [92 %-99 %] 94 % (02/15 1900)    Intake/Output from previous day: 02/14 0701 - 02/15 0700 In: 1716 [I.V.:1616; IV Piggyback:100] Out: 595 [Urine:595]  Intake/Output this shift: No intake/output data recorded.   Review of Systems: No pain Other ROS limited  Physical Exam:  Patient stuperous  HEENT: No respiratory distress, trachea is midline, left-sided  facial droop Cardiovascular: Patient with atrial fibrillation, irregularly irregular rhythm, ventricular responses in the 118-  140's Pulmonary: Scant basilar crackles appreciated right greater than left Abdominal: Positive bowel sounds, soft exam Extremities: No clubbing cyanosis or edema noted Neurologic: Patient with right-sided gaze, left-sided neglect with facial asymmetry does not remove all extremities   Assessment/Plan:  84 yo female with history of afib not anticoagulated due to comorbidity and fall history who was admitted after being noted to have right sided gaze preference and left sided neglect c/w with acute cva.     1. CVA  Patient with intermittent agitation.  2. Congestive heart failure with no current distress.  Presently on nasal cannula   3. Atrial fibrillation with not well controlled ventricular rate.  Patient on a amiodarone and Diltiazem infusion-follow up cardiology. Management as per Cardiology.  4 Hypertension. Blood pressure stable as morning  Leukocytosis continues to trend downward.  No clear evidence of infection noted- will monitor.  Renal insufficiency- resolved.  Plan:  1. Social service consult to address home care needs 2. Management as per Cardiology 3. Will monitor renal function. 4. Family addressing possibility of hospice.   Overall prognosis is poor Recommend palliative care consult and hospice  Transfer to floor when HR stable  Patient's daughter arrived and was updated.  She will decide on PEG tube placement by tomorrow.   Cammie Sickle, M.D.  [facility]

## 2017-11-06 NOTE — Progress Notes (Signed)
Very agitated all day. Stool x 2 with complete bath and bed change as well. Remains in raped Atrial Fib even though she is receiving Cardizem ans Amiodorone IV. Potassium replacement given for K+ of 3.5 Bilateral hand mitts on all day. Patient easily removes mitts.

## 2017-11-06 NOTE — Progress Notes (Addendum)
Pharmacy ICU  Monitoring Consult:  Pharmacy consulted to assist in monitoring and replacing electrolytes in this 84 y.o. female admitted on 10/31/2017 with Altered Mental Status   Labs:  Sodium (mmol/L)  Date Value  11/06/2017 135   Potassium (mmol/L)  Date Value  11/06/2017 3.5   Magnesium (mg/dL)  Date Value  11/06/2017 2.0   Phosphorus (mg/dL)  Date Value  11/06/2017 3.0   Calcium (mg/dL)  Date Value  11/06/2017 9.3   Albumin (g/dL)  Date Value  10/31/2017 3.5    Plan: 1. Electrolytes: Will order potassium 42mEq IV Q1hr x 3 doses. Goal potassium is ~ 4, goal mag ~ 2, and goal phos ~ 2.5. Will recheck electrolytes with am labs on 2/17.   2. Glucose: no changes in therapy warranted  3. Constipation: patient remains NPO. Will continue to monitor.   Pharmacy will continue to monitor and adjust per consult.   Simpson,Michael L 11/06/2017 2:56 PM

## 2017-11-06 NOTE — Consult Note (Signed)
Consultation Note Date: 11/06/2017   Patient Name: Emily Moyer  DOB: 06-25-1924  MRN: [id number]  Age / Sex: 84 y.o., female  PCP: Ezequiel Kayser, MD Referring Physician: Max Sane, MD  Reason for Consultation: Establishing goals of care  HPI/Patient Profile: Per EMR, Emily Moyer  is a 84 y.o. female with a known history of chronic atrial fibrillation only on aspirin due to falls, essential hypertension previous CVA who was recently hospitalized with a UTI and pneumonia who left AMA.  She was brought to the hospital with stroke like symptoms. Per EMR, in the ED she wanted to go home and has been noncompliant with all her medications other than aspirin.     Clinical Assessment and Goals of Care: Patient is resting in bed. She appears calm. Unable to answer questions.   Spoke with daughter via telephone today as we have previously been unable to arrange a [place] meeting and she declined a phone [place] meeting. She states she is on her lunch break but can talk for a bit.  She states she spoke with someone yesterday about a feeding tube. She tells me she has a friend who's father had a stroke and recently died. He had a feeding tube placed before.   We discussed diagnosis, prognosis, and GOC.  We discussed an aggressive treatment plan approach vs a hospice comfort care approach.   We discussed feeding tube placement as well as her quality of life. She states she and her mother never discussed a feeding tube, but her mother told her she would not want to be put on machines to stay alive. She states she will consider her options. She will come this evening when she gets off work and gets a ride, but is not sure when it will be.   Daughter is Media planner as patient is confused.     SUMMARY OF RECOMMENDATIONS    Daughter to come tonight to speak with CCM provider about goals and plan of care.   If  family decides on Hospice over the weekend, a consult can be placed to care management to arrange hospice.    Code Status/Advance Care Planning:  DNR    Symptom Management:   Per primary team.   Palliative Prophylaxis:   Oral Care   Prognosis:   Poor.   Discharge Planning: To Be Determined      Primary Diagnoses: Present on Admission: . CVA (cerebral vascular accident) ([place])   I have reviewed the medical record, interviewed the patient and family, and examined the patient. The following aspects are pertinent.  Past Medical History:  Diagnosis Date  . Arthritis   . B12 deficiency 03/18/2014  . Benign essential HTN 03/18/2014  . Benign neoplasm of skin of upper limb, including shoulder   . Breast cancer ([place]) 12/02/02   lumpectomy/rad-positive  . 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 Colon polyp   . Hypertension   . Malignant neoplasm of lower-inner quadrant of female breast ([place])    11/03/2002: T1 C., N0 invasive mammary carcinoma,  ER-positive, HER-2/neu not overexpressing. Treated with wide excision and sentinel node biopsy.  . Malignant neoplasm of upper limb ([facility])    left thumb, T1, N0 treated by partial thumb amputation, sentinel node biopsy.  [place] Neuropathy    legs  . Peripheral nerve disease 03/18/2014  . SCC (squamous cell carcinoma), arm 05/05/2013   Overview:  Overview:  Left   . Skin cancer   . Stroke [facility])    Social History   Socioeconomic History  . Marital status: Widowed    Spouse name: None  . Number of children: None  . Years of education: None  . Highest education level: None  Social Needs  . Financial resource strain: None  . Food insecurity - worry: None  . Food insecurity - inability: None  . Transportation needs - medical: None  . Transportation needs - non-medical: None  Occupational History  . None  Tobacco Use  . Smoking status: Never Smoker  . Smokeless tobacco: Current User    Types: Snuff  . Tobacco comment: used 35 years    Substance and Sexual Activity  . Alcohol use: No  . Drug use: No  . Sexual activity: None  Other Topics Concern  . None  Social History Narrative  . None   Family History  Problem Relation Age of Onset  . Healthy Mother   . Healthy Father   . Dementia Sister   . Breast cancer Neg Hx    Scheduled Meds: .  stroke: mapping our early stages of recovery book   Does not apply Once  . chlorhexidine  15 mL Mouth Rinse BID  . mouth rinse  15 mL Mouth Rinse q12n4p   Continuous Infusions: . amiodarone 60 mg/hr (11/06/17 1124)  . diltiazem (CARDIZEM) infusion 15 mg/hr (11/06/17 0937)   PRN Meds:.acetaminophen **OR** acetaminophen (TYLENOL) oral liquid 160 mg/5 mL **OR** acetaminophen, haloperidol lactate, traMADol Medications Prior to Admission:  Prior to Admission medications   Medication Sig Start Date End Date Taking? Authorizing Provider  amLODipine (NORVASC) 10 MG tablet Take 10 mg by mouth daily.    Yes [provider]  aspirin 81 MG tablet Take 81 mg by mouth daily.   Yes [provider]  atorvastatin (LIPITOR) 40 MG tablet Take 1 tablet by mouth daily. 04/16/13  Yes [provider]  Cholecalciferol (VITAMIN D-3) 1000 UNITS CAPS Take 1 capsule by mouth daily.   Yes [provider]  lisinopril-hydrochlorothiazide (PRINZIDE,ZESTORETIC) 20-12.5 MG per tablet Take 2 tablets by mouth daily.  04/16/13  Yes [provider]  metoprolol succinate (TOPROL-XL) 100 MG 24 hr tablet Take 1 tablet by mouth daily. 04/30/13  Yes [provider]  Omega-3 Fatty Acids (FISH OIL) 1200 MG CAPS Take 1 capsule by mouth daily.   Yes [provider]  traMADol (ULTRAM) 50 MG tablet Take 50 mg by mouth every 6 (six) hours as needed.  06/17/17  Yes [provider]  vitamin B-12 (CYANOCOBALAMIN) 500 MCG tablet Take 500 mcg by mouth daily.   Yes [provider]  ciprofloxacin (CIPRO) 250 MG tablet Take 1 tablet (250 mg total) by mouth  every 12 (twelve) hours. Patient not taking: Reported on 10/20/2017 10/02/17   Coral Spikes, DO   Allergies  Allergen Reactions  . Aspirin-Dipyridamole Er     Other reaction(s): Unknown  . Dipyridamole     Other reaction(s): Unknown  . Lyrica [Pregabalin] Swelling  . Clonidine Other (See Comments)    Dry mouth  . Cymbalta  [  Duloxetine Hcl]     Other reaction(s): Other (See Comments) insomnia  . Duloxetine     Other reaction(s): Other (See Comments) insomnia   Review of Systems  Unable to perform ROS   Physical Exam  Constitutional: No distress.  Pulmonary/Chest: Effort normal.  Neurological: She is alert.  Skin: Skin is warm and dry.    Vital Signs: BP 135/74   Pulse (!) 138   Temp (!) 97.4 F (36.3 C) (Axillary)   Resp (!) 25   Wt 63.9 kg (140 lb 14 oz)   SpO2 97%   BMI 22.74 kg/m  Pain Assessment: No/denies pain POSS *See Group Information*: 1-Acceptable,Awake and alert Pain Score: 0-No pain   SpO2: SpO2: 97 % O2 Device:SpO2: 97 % O2 Flow Rate: .O2 Flow Rate (L/min): 2 L/min  IO: Intake/output summary:   Intake/Output Summary (Last 24 hours) at 11/06/2017 1255 Last data filed at 11/06/2017 0630 Gross per 24 hour  Intake 1299.47 ml  Output 365 ml  Net 934.47 ml    LBM: Last BM Date: 11/05/17 Baseline Weight: Weight: 63.9 kg (140 lb 14 oz) Most recent weight: Weight: 63.9 kg (140 lb 14 oz)     Palliative Assessment/Data: NPO     Time In: 12:00 Time Out: 12:35 Time Total: 35 min Greater than 50%  of this time was spent counseling and coordinating care related to the above assessment and plan.  Signed by: Asencion Gowda, NP   Please contact Palliative Medicine Team phone at [phone] for questions and concerns.  For individual provider: See Shea Evans

## 2017-11-06 NOTE — Care Management (Signed)
RNCM waiting for MD to update regarding discharge needs.  LTAC screens from both [place] with [facility] and [place] [facility] [employer] [facility].  Hospice seems most appropriate however if family decides on PEG placement- [place] with [facility] can assist (pending SNF days available under Medicare).  This has not been discussed with patient or daughters in order to not cause any confusion.  RNCM will wait on direction from MD based on conversation related to plan of care for this patient.

## 2017-11-07 DIAGNOSIS — I6340 Cerebral infarction due to embolism of unspecified cerebral artery: Secondary | ICD-10-CM

## 2017-11-07 DIAGNOSIS — I4891 Unspecified atrial fibrillation: Secondary | ICD-10-CM

## 2017-11-07 MED ORDER — MORPHINE SULFATE (PF) 2 MG/ML IV SOLN
2.0000 mg | INTRAVENOUS | Status: DC | PRN
  Administered 2017-11-08: 02:00:00 2 mg via INTRAVENOUS
  Filled 2017-11-07: qty 1

## 2017-11-07 NOTE — Progress Notes (Signed)
Follow up - Critical Care Medicine Note  Patient Details:    Emily Moyer is an 84 y.o. female.  Emily Moyer is a 84 year old female with a past medical history remarkable for hypertension, diabetes, neuropathy, CVA, breast carcinoma, atrial fibrillation on aspirin due to falls,receently hospitalized here recently with a UTI and pneumonia who left AGAINST MEDICAL ADVICE recently. She is brought in by her daughter because she was found sitting on the side of her bed with right-sided gaze deviation, slurred speech and left-sided neglect by her daughter.    CC follow up mental status changes  HPI More alert today Patient is DNR/DNI afib now controlled since starting Precedex, now off Amiodarone and Cardrizem drips   CT HEAD 1. Moderate to large right frontal acute infarct. There is likely small volume superficial/petechial hemorrhage.No flow limiting stenosis or embolic source seen in the right anterior circulation. 2. Left MCA origin occlusion with limited branch opacification in the arterial phase. No visible left hemispheric infarct, this is an age indeterminate finding. 3. 60% atheromatous left ICA bulb narrowing. 4. Remote right parietal and left lateral lenticulostriate infarcts.     Consults: Treatment Team:  Teodoro Spray, MD Clovis Fredrickson, MD Leotis Pain, MD Clapacs, Madie Reno, MD    Objective:  Vital signs for last 24 hours: Temp:  [94.3 F (34.6 C)-98.6 F (37 C)] 97.8 F (36.6 C) (02/16 0845) Pulse Rate:  [64-127] 91 (02/16 0845) Resp:  [15-34] 21 (02/16 0845) BP: (98-153)/(49-127) 98/59 (02/16 0800) SpO2:  [91 %-100 %] 97 % (02/16 0845)    Intake/Output from previous day: 02/15 0701 - 02/16 0700 In: 818.1 [I.V.:818.1] Out: 775 [Urine:775]  Intake/Output this shift: Total I/O In: 35.2 [I.V.:35.2] Out: -    Review of Systems: No pain Other ROS limited  Physical Exam:  Patient stuperous  HEENT: No respiratory  distress, trachea is midline, left-sided facial droop Cardiovascular: Patient with atrial fibrillation, irregularly irregular rate now controlled Pulmonary: Scant basilar crackles appreciated right greater than left Abdominal: Positive bowel sounds, soft exam Extremities: No clubbing cyanosis or edema noted Neurologic: Patient with right-sided gaze, left-sided neglect with facial asymmetry does remove all extremities, oriented to self and place but confused   Assessment/Plan:  84 yo female with history of afib not anticoagulated due to comorbidity and fall history who was admitted after being noted to have right sided gaze preference and left sided neglect c/w with acute cva.     1. CVA - Right parietal  Patient is much calmer since being started on Precedex  2. Congestive heart failure with no current distress.  Presently on nasal cannula   3. Atrial fibrillation now well controlled off Amiodarone and Cardizem drip.  She is on Precedex for agitation control  4 Hypertension. Blood pressure stable thus far  Leukocytosis has almost resolved.  No clear evidence of infection noted- will monitor.  Renal insufficiency- resolved.  Plan:  1. Social service is on consult to assist with home care needs 2. Patient is now off Amiodarone and Diltiazem drip but on Precedex- will wean off Precedex drip.   Overall prognosis is poor, she remains at risk for repeat CVA  Patient's daughter arrived and was updated.  She decided to proceed with Hospice care.   Transfer to floor while awaiting Hospice consult.    Cammie Sickle, M.D.  [facility]

## 2017-11-07 NOTE — Progress Notes (Signed)
Verbal report given to Maddie, RN on 1-C. Patient transported on oxygen at 4 lpm via hospital bed to room 101 by [place], [place].

## 2017-11-07 NOTE — Progress Notes (Addendum)
OT Cancellation Note  Patient Details Name: Emily Moyer MRN: [id number] DOB: [birth year]   Cancelled Treatment:    Reason Eval/Treat Not Completed: Other (comment). Upon attempt to treat, pt sleeping (chart review noting Precedex since yesterday evening with latest dose given at 12pm today), family in room meeting with MD. Will re-attempt OT treatment at later date/time as pt is available.   Jeni Salles, MPH, MS, OTR/L ascom [phone] 11/07/17, 12:59 PM

## 2017-11-07 NOTE — Progress Notes (Signed)
[facility] [place] NAME: Emily Moyer    MR#:  [id number]  DATE OF BIRTH:  06-04-1924  SUBJECTIVE: Family wants the patient to be transferred to hospice home .  CHIEF COMPLAINT:   Chief Complaint  Patient presents with  . Altered Mental Status  Confused, waiting for family decision REVIEW OF SYSTEMS:   ROS: unable to be obtained due to encephalopathy  DRUG ALLERGIES:   Allergies  Allergen Reactions  . Aspirin-Dipyridamole Er     Other reaction(s): Unknown  . Dipyridamole     Other reaction(s): Unknown  . Lyrica [Pregabalin] Swelling  . Clonidine Other (See Comments)    Dry mouth  . Cymbalta  [Duloxetine Hcl]     Other reaction(s): Other (See Comments) insomnia  . Duloxetine     Other reaction(s): Other (See Comments) insomnia    VITALS:  Blood pressure 108/63, pulse 88, temperature 97.8 F (36.6 C), temperature source Axillary, resp. rate 16, weight 63.9 kg (140 lb 14 oz), SpO2 98 %.  PHYSICAL EXAMINATION:  GENERAL:  84 y.o.-year-old patient lying in the bed   EYES: Pupils equal, round, reactive to light  HEENT: Head atraumatic, normocephalic. Oropharynx and nasopharynx clear.  NECK:  Supple, no jugular venous distention. No thyroid enlargement, no tenderness.  LUNGS: Normal breath sounds bilaterally, no wheezing, rales,rhonchi or crepitation. No use of accessory muscles of respiration.  CARDIOVASCULAR: S1, S2 normal. No murmurs, rubs, or gallops.  ABDOMEN: Soft, nontender, nondistended. Bowel sounds present. No organomegaly or mass.  EXTREMITIES: No pedal edema, cyanosis, or clubbing.  NEUROLOGIC: sedated with  percedex drip. PSYCHIATRIC: The patient is lethargic and confused SKIN: No obvious rash, lesion, or ulcer.   Physical Exam LABORATORY PANEL:   CBC Recent Labs  Lab 11/06/17 0809  WBC 13.0*  HGB 12.3  HCT 37.8  PLT 320    ------------------------------------------------------------------------------------------------------------------  Chemistries  Recent Labs  Lab 11/06/17 1002  NA 135  K 3.5  CL 100*  CO2 21*  GLUCOSE 127*  BUN 18  CREATININE 0.88  CALCIUM 9.3  MG 2.0   ------------------------------------------------------------------------------------------------------------------  Cardiac Enzymes Recent Labs  Lab 10/31/17 2007  TROPONINI <0.03   ------------------------------------------------------------------------------------------------------------------  RADIOLOGY:  No results found.  ASSESSMENT AND PLAN:  Patient is a 84 year old white female with history of atrial fibrillation presenting with strokelike symptoms  1.Acute large right frontal lobe infarct  CT angiogram of the head and neck noted for large right frontal lobe infarct and occlusion of left MCA in discussion with radiology, continue CVA protocol, echocardiogram noted for ejection fraction 55-65%, daughter decided about hospice home.  She is right now on Precedex, transfer to hospice home when the bed is very available.   2.Acute A. fib with RVR -Discontinue amiodarone, Cardizem drips as per family wishes they do not want any medicines except Precedex drip.  3.Essential hypertension, chronic Stable  4.Medical noncompliance Noted previous admission patient leftAMA DOSinvolved, psychiatry did see patient while in house - pt is non-verbal/refusing to take meds  5.Chronic noncompliance of medical management Stable  DNR status Long-term prognosis is dismal  Disposition to LTAC if family in agreement  Family to decide goals of care in conjunction with ICU team  All the records are reviewed and case discussed with Care Management/Social Workerr. Management plans discussed with the patient, nursing and they are in agreement.  CODE STATUS: dnr  TOTAL TIME TAKING CARE OF THIS PATIENT: 10  minutes.       Chick Cousins Vianne Bulls  M.D on 11/07/2017   Between 7am to 6pm - Pager - [phone]  After 6pm go to [email or web address] - password [email or web address] [facility]  Office  [phone]  CC: Primary care physician; Ezequiel Kayser, MD  Note: This dictation was prepared with Dragon dictation along with smaller phrase technology. Any transcriptional errors that result from this process are unintentional.

## 2017-11-07 NOTE — Clinical Social Work Note (Signed)
CSW received verbal consult for referral to hospice home. CSW has sent all documentation for the referral. [facility] currently has no bed availability. CSW will update as more information is available.  Santiago Bumpers, MSW, Latanya Presser [phone]

## 2017-11-08 LAB — BASIC METABOLIC PANEL
ANION GAP: 12 (ref 5–15)
BUN: 21 mg/dL — ABNORMAL HIGH (ref 6–20)
CHLORIDE: 108 mmol/L (ref 101–111)
CO2: 20 mmol/L — ABNORMAL LOW (ref 22–32)
Calcium: 9.1 mg/dL (ref 8.9–10.3)
Creatinine, Ser: 0.9 mg/dL (ref 0.44–1.00)
GFR, EST NON AFRICAN AMERICAN: 54 mL/min — AB (ref >60)
Glucose, Bld: 74 mg/dL (ref 65–99)
POTASSIUM: 4.7 mmol/L (ref 3.5–5.1)
SODIUM: 140 mmol/L (ref 135–145)

## 2017-11-08 LAB — MAGNESIUM: MAGNESIUM: 1.7 mg/dL (ref 1.7–2.4)

## 2017-11-08 LAB — PHOSPHORUS: PHOSPHORUS: 3.8 mg/dL (ref 2.5–4.6)

## 2017-11-08 MED ORDER — MORPHINE SULFATE (CONCENTRATE) 10 MG/0.5ML PO SOLN
10.0000 mg | ORAL | Status: DC | PRN
  Administered 2017-11-08: 23:00:00 10 mg via ORAL
  Filled 2017-11-08: qty 1

## 2017-11-08 MED ORDER — MORPHINE SULFATE (CONCENTRATE) 10 MG/0.5ML PO SOLN: 10.0000 mg | ORAL | 0 refills | Status: AC | PRN

## 2017-11-08 MED ORDER — LORAZEPAM 2 MG/ML PO CONC
0.5000 mg | ORAL | Status: DC | PRN
  Administered 2017-11-08: 0.5 mg via ORAL
  Filled 2017-11-08: qty 1

## 2017-11-08 MED ORDER — LORAZEPAM 2 MG/ML PO CONC: 0.5000 mg | ORAL | 0 refills | Status: AC | PRN

## 2017-11-08 NOTE — Progress Notes (Addendum)
[facility] [place] NAME: Emily Moyer    MR#:  [id number]  DATE OF BIRTH:  [birth year]  SUBJECTIVE:  No Overnight events, transferred out of ICU yesterday.  CHIEF COMPLAINT:   Chief Complaint  Patient presents with  . Altered Mental Status  Confused, waiting for family decision REVIEW OF SYSTEMS:   ROS: unable to be obtained due to encephalopathy  DRUG ALLERGIES:   Allergies  Allergen Reactions  . Aspirin-Dipyridamole Er     Other reaction(s): Unknown  . Dipyridamole     Other reaction(s): Unknown  . Lyrica [Pregabalin] Swelling  . Clonidine Other (See Comments)    Dry mouth  . Cymbalta  [Duloxetine Hcl]     Other reaction(s): Other (See Comments) insomnia  . Duloxetine     Other reaction(s): Other (See Comments) insomnia    VITALS:  Blood pressure (!) 144/99, pulse (!) 109, temperature 98.5 F (36.9 C), temperature source Axillary, resp. rate 18, weight 63.9 kg (140 lb 14 oz), SpO2 98 %.  PHYSICAL EXAMINATION:  GENERAL:  84 y.o.-year-old patient lying in the bed   EYES: Pupils equal, round, reactive to light  HEENT: Head atraumatic, normocephalic. Oropharynx and nasopharynx clear.  NECK:  Supple, no jugular venous distention. No thyroid enlargement, no tenderness.  LUNGS: Normal breath sounds bilaterally, no wheezing, rales,rhonchi or crepitation. No use of accessory muscles of respiration.  CARDIOVASCULAR: S1, S2 normal. No murmurs, rubs, or gallops.  ABDOMEN: Soft, nontender, nondistended. Bowel sounds present. No organomegaly or mass.  EXTREMITIES: No pedal edema, cyanosis, or clubbing.  NEUROLOGIC: sedated with  percedex drip. PSYCHIATRIC: The patient is lethargic and confused SKIN: No obvious rash, lesion, or ulcer.   Physical Exam LABORATORY PANEL:   CBC Recent Labs  Lab 11/06/17 0809  WBC 13.0*  HGB 12.3  HCT 37.8  PLT 320    ------------------------------------------------------------------------------------------------------------------  Chemistries  Recent Labs  Lab 11/08/17 0549  NA 140  K 4.7  CL 108  CO2 20*  GLUCOSE 74  BUN 21*  CREATININE 0.90  CALCIUM 9.1  MG 1.7   ------------------------------------------------------------------------------------------------------------------  Cardiac Enzymes No results for input(s): TROPONINI in the last 168 hours. ------------------------------------------------------------------------------------------------------------------  RADIOLOGY:  No results found.  ASSESSMENT AND PLAN:  Patient is a 84 year old white female with history of atrial fibrillation presenting with strokelike symptoms  1.Acute large right frontal lobe infarct  CT angiogram of the head and neck noted for large right frontal lobe infarct and occlusion of left MCA in discussion with radiology, continue CVA protocol, echocardiogram noted for ejection fraction 55-65%, daughter decided about hospice home.  She is right now on Precedex, transfer to hospice home when the bed is very available.   2.Acute A. fib with RVR -Discontinue amiodarone, Cardizem drips as per family wishes /no focus on comfort measures.  Will discontinue the Precedex drip.  Had extensive discussion with the family especially the daughter yesterday.  She is aware that  patient will go to hospice home if the bed is available today.  Discharge instructions are in the computer.  3.Essential hypertension, chronic Stable  4.Medical noncompliance Noted previous admission patient leftAMA DOSinvolved, psychiatry did see patient while in house - pt is non-verbal/refusing to take meds  5.Chronic noncompliance of medical management Stable  DNR status Long-term prognosis is dismal  Disposition to LTAC if family in agreement Likely discharge to hospice home today with morphine, Ativan only. Life  expectancy may be less than 2 weeks  All the records  are reviewed and case discussed with Care Management/Social Workerr. Management plans discussed with the patient, nursing and they are in agreement.  CODE STATUS: dnr  TOTAL TIME TAKING CARE OF THIS PATIENT: 10 minutes.       Epifanio Lesches M.D on 11/08/2017   Between 7am to 6pm - Pager - [phone]  After 6pm go to [email or web address] - password [email or web address] [place] [facility]  Office  [phone]  CC: Primary care physician; Ezequiel Kayser, MD  Note: This dictation was prepared with Dragon dictation along with smaller phrase technology. Any transcriptional errors that result from this process are unintentional.

## 2017-11-08 NOTE — Progress Notes (Signed)
Pt's BP is low at 92/76 and HR is elevated at 121. Pt continues to deny pain and PAINAD score is 0. Dr. Vianne Bulls notified, no new orders received.

## 2017-11-08 NOTE — Clinical Social Work Note (Signed)
CSW has updated [place] of progress for this patient. The liaison will assess this patient on 11/09/17 per the admissions coordinator. CSW is following.  Santiago Bumpers, MSW, Latanya Presser [phone]

## 2017-11-08 NOTE — Progress Notes (Signed)
During rounds Dr. Vianne Bulls stated that pt no longer needs to be monitored on tele, no longer needs to have a Q shift NIH score assessed, no longer needs to have neuro assessment every 4 hours, but to collect VS Q12. Orders updated in epic.

## 2017-11-08 NOTE — Progress Notes (Signed)
Pharmacy ICU  Monitoring Consult:  Pharmacy consulted to assist in monitoring and replacing electrolytes in this 84 y.o. female admitted on 10/31/2017 with Altered Mental Status   Labs:  Sodium (mmol/L)  Date Value  11/08/2017 140   Potassium (mmol/L)  Date Value  11/08/2017 4.7   Magnesium (mg/dL)  Date Value  11/08/2017 1.7   Phosphorus (mg/dL)  Date Value  11/08/2017 3.8   Calcium (mg/dL)  Date Value  11/08/2017 9.1   Albumin (g/dL)  Date Value  10/31/2017 3.5    Plan: Electrolytes remain WNL. No supplementation warranted. Will sign off - please reconsult if needed.   Lenis Noon, PharmD, BCPS Clinical Pharmacist 11/08/2017 7:11 AM

## 2017-11-09 NOTE — Progress Notes (Signed)
Follow up on new hospice home referral received on 2/17. Patient seen sitting up in bed, eyes closed, opened to name, speech unintelligible. Patient allowed mouth care to be performed, face cleaned. She is not eating or drinking, has not eaten since admission nor taken any oral medications. Writer spoke over the phone to her daughter [name], who reports that she is at work and will not be able to get a ride to the hospital until after 3:30. Arrangements made for [name] to meet with hospice social worker at the hospice home at 4:30 pm. Report called to the hospice home, discharge summary faxed to referral. Staff RN Maddie to notify EMS for transport.  Flo Shanks RN, BSN, [facility] [place], hospital liaison [phone]

## 2017-11-09 NOTE — Discharge Instructions (Signed)
Hospice Introduction Hospice is a service that is designed to provide people who are terminally ill and their families with medical, spiritual, and psychological support. Its aim is to improve your quality of life by keeping you as alert and comfortable as possible. Who will be my providers when I begin hospice care? Hospice teams often include:  A nurse.  A doctor. The hospice doctor will be available for your care, but you can bring your regular doctor or nurse practitioner.  Social workers.  Religious leaders (such as a Clinical biochemist).  Trained volunteers.  What roles will providers play in my care? Hospice is performed by a team of health care professionals and volunteers who:  Help keep you comfortable: ? Hospice can be provided in your home or in a homelike setting. ? The hospice staff works with your family and friends to help meet your needs. ? You will enjoy the support of loved ones by receiving much of your basic care from family and friends.  Provide pain relief and manage your symptoms. The staff supply all necessary medicines and equipment.  Provide companionship when you are alone.  Allow you and your family to rest. They may do light housekeeping, prepare meals, and run errands.  Provide counseling. They will make sure your emotional, spiritual, and social needs and those of your family are being met.  Provide spiritual care: ? Spiritual care will be individualized to meet your needs and your family's needs. ? Spiritual care may involve:  Helping you look at what death means to you.  Helping you say goodbye to your family and friends.  Performing a specific religious ceremony or ritual.  When should hospice care begin? Most people who use hospice are believed to have fewer than 6 months to live.  Your family and health care providers can help you decide when hospice services should begin.  If your condition improves, you may discontinue the program.  What  should I consider before selecting a program? Most hospice programs are run by nonprofit, independent organizations. Some are affiliated with hospitals, nursing homes, or home health care agencies. Hospice programs can take place in the home or at a hospice center, hospital, or skilled nursing facility. When choosing a hospice program, ask the following questions:  What services are available to me?  What services will be offered to my loved ones?  How involved will my loved ones be?  How involved will my health care provider be?  Who makes up the hospice care team? How are they trained or screened?  How will my pain and symptoms be managed?  If my circumstances change, can the services be provided in a different setting, such as my home or in the hospital?  Is the program reviewed and licensed by the state or certified in some other way?  Where can I learn more about hospice? You can learn about existing hospice programs in your area from your health care providers. You can also read more about hospice online. The websites of the following organizations contain helpful information:  The [facility] and Palliative Care Organization [facility]).  The Hospice Association of America ([place]).  The [place].  The American Cancer Society (ACS).  Hospice Net.  This information is not intended to replace advice given to you by your health care provider. Make sure you discuss any questions you have with your health care provider. Document Released: 12/26/2003 Document Revised: 04/24/2016 Document Reviewed: 07/19/2013 Elsevier Interactive Patient Education  2017 [employer].

## 2017-11-09 NOTE — Progress Notes (Signed)
Please note - patient is Hospice Home appropriate and has life expectancy less than 2 weeks.

## 2017-11-09 NOTE — Progress Notes (Signed)
Weekend Clinical Education officer, museum (CSW) sent referral to [facility]. Per [facility] liaison they have a bed for patient today. MD and RN aware of above. CSW prepared EMS form including DNR. Please reconsult if future social work needs arise. CSW signing off.   [employer], LCSW [phone]

## 2017-11-09 NOTE — Progress Notes (Signed)
[facility] called for transportation to hospice home. Pt discharging with foley catheter and PIV intact. Ammie Dalton, RN

## 2017-11-09 NOTE — Discharge Summary (Signed)
[facility] [place] NAME: Emily Moyer    MR#:  [id number]  DATE OF BIRTH:  [birth year]  DATE OF ADMISSION:  10/31/2017   ADMITTING PHYSICIAN: Dustin Flock, MD  DATE OF DISCHARGE: No discharge date for patient encounter.  PRIMARY CARE PHYSICIAN: Ezequiel Kayser, MD   ADMISSION DIAGNOSIS:  SOB (shortness of breath) [R06.02] CVA (cerebral vascular accident) ([place]) [I63.9] Atrial fibrillation with RVR ([place]) [I48.91] Cerebrovascular accident (CVA), unspecified mechanism ([place]) [I63.9] DISCHARGE DIAGNOSIS:  Active Problems:   CVA (cerebral vascular accident) ([place])   Protein-calorie malnutrition, severe  SECONDARY DIAGNOSIS:   Past Medical History:  Diagnosis Date  . Arthritis   . B12 deficiency 03/18/2014  . Benign essential HTN 03/18/2014  . Benign neoplasm of skin of upper limb, including shoulder   . Breast cancer ([place]) 12/02/02   lumpectomy/rad-positive  . 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 Colon polyp   . Hypertension   . Malignant neoplasm of lower-inner quadrant of female breast ([place])    11/03/2002: T1 C., N0 invasive mammary carcinoma, ER-positive, HER-2/neu not overexpressing. Treated with wide excision and sentinel node biopsy.  . Malignant neoplasm of upper limb ([facility])    left thumb, T1, N0 treated by partial thumb amputation, sentinel node biopsy.  Marland Kitchen Neuropathy    legs  . Peripheral nerve disease 03/18/2014  . SCC (squamous cell carcinoma), arm 05/05/2013   Overview:  Overview:  Left   . Skin cancer   . Stroke [facility])    HOSPITAL COURSE:  84 y.o.femalewith a known history of Breast cancer, HTN, colon polyp, arthritis, h/o stroke, and history of atrial fibrillation presenting with stroke like symptoms  *Acute large right frontal lobe infarct  *Persistent A. fib  *Essential hypertension, chronic *Medical noncompliance - previous admission patient leftAMA * left lung mass This is incidental finding, but she has  a history of breast cancer. * hyperlipidemia  Patient's family has chosen comfort care due to very poor prognosis (less than 2 weeks) and poor quality of life. She is appropriate for Hospice Home placement with prognosis < 2 weeks DISCHARGE CONDITIONS:  fair CONSULTS OBTAINED:  Treatment Team:  Teodoro Spray, MD Clovis Fredrickson, MD Leotis Pain, MD Clapacs, Madie Reno, MD DRUG ALLERGIES:   Allergies  Allergen Reactions  . Aspirin-Dipyridamole Er     Other reaction(s): Unknown  . Dipyridamole     Other reaction(s): Unknown  . Lyrica [Pregabalin] Swelling  . Clonidine Other (See Comments)    Dry mouth  . Cymbalta  [Duloxetine Hcl]     Other reaction(s): Other (See Comments) insomnia  . Duloxetine     Other reaction(s): Other (See Comments) insomnia   DISCHARGE MEDICATIONS:   Allergies as of 11/09/2017      Reactions   Aspirin-dipyridamole Er    Other reaction(s): Unknown   Dipyridamole    Other reaction(s): Unknown   Lyrica [pregabalin] Swelling   Clonidine Other (See Comments)   Dry mouth   Cymbalta  [duloxetine Hcl]    Other reaction(s): Other (See Comments) insomnia   Duloxetine    Other reaction(s): Other (See Comments) insomnia      Medication List    STOP taking these medications   amLODipine 10 MG tablet Commonly known as:  NORVASC   aspirin 81 MG tablet   atorvastatin 40 MG tablet Commonly known as:  LIPITOR   ciprofloxacin 250 MG tablet Commonly known as:  CIPRO   Fish Oil 1200 MG Caps  lisinopril-hydrochlorothiazide 20-12.5 MG tablet Commonly known as:  PRINZIDE,ZESTORETIC   metoprolol succinate 100 MG 24 hr tablet Commonly known as:  TOPROL-XL   traMADol 50 MG tablet Commonly known as:  ULTRAM   vitamin B-12 500 MCG tablet Commonly known as:  CYANOCOBALAMIN   Vitamin D-3 1000 units Caps     TAKE these medications   LORazepam 2 MG/ML concentrated solution Commonly known as:  ATIVAN Take 0.3 mLs (0.6 mg total) by  mouth every 4 (four) hours as needed for anxiety.   morphine CONCENTRATE 10 MG/0.5ML Soln concentrated solution Take 0.5 mLs (10 mg total) by mouth every 2 (two) hours as needed for severe pain or shortness of breath.        DISCHARGE INSTRUCTIONS:   DIET:  Regular diet DISCHARGE CONDITION:  Fair ACTIVITY:  Activity as tolerated OXYGEN:  Home Oxygen: Yes.    Oxygen Delivery: 2 liters/min via Patient connected to nasal cannula oxygen DISCHARGE LOCATION:  Hospice Home   If you experience worsening of your admission symptoms, develop shortness of breath, life threatening emergency, suicidal or homicidal thoughts you must seek medical attention immediately by calling 911 or calling your MD immediately  if symptoms less severe.  You Must read complete instructions/literature along with all the possible adverse reactions/side effects for all the Medicines you take and that have been prescribed to you. Take any new Medicines after you have completely understood and accpet all the possible adverse reactions/side effects.   Please note  You were cared for by a hospitalist during your hospital stay. If you have any questions about your discharge medications or the care you received while you were in the hospital after you are discharged, you can call the unit and asked to speak with the hospitalist on call if the hospitalist that took care of you is not available. Once you are discharged, your primary care physician will handle any further medical issues. Please note that NO REFILLS for any discharge medications will be authorized once you are discharged, as it is imperative that you return to your primary care physician (or establish a relationship with a primary care physician if you do not have one) for your aftercare needs so that they can reassess your need for medications and monitor your lab values.    On the day of Discharge:  VITAL SIGNS:  Blood pressure 124/81, pulse 96,  temperature 98.3 F (36.8 C), resp. rate 17, weight 63.9 kg (140 lb 14 oz), SpO2 99 %. PHYSICAL EXAMINATION:  GENERAL:  84 y.o.-year-old patient lying in the bed with no acute distress.  HEENT:           No respiratory distress, trachea is midline, left-sided facial droop Cardiovascular:           Patient with atrial fibrillation, irregularly irregular rate now controlled Pulmonary:      Scant basilar crackles appreciated right greater than left Abdominal:      Positive bowel sounds, soft exam Extremities:     No clubbing cyanosis or edema noted Neurologic:      Patient with right-sided gaze, left-sided neglect with facial asymmetry does remove all extremities, oriented to self and place but confused DATA REVIEW:   CBC Recent Labs  Lab 11/06/17 0809  WBC 13.0*  HGB 12.3  HCT 37.8  PLT 320    Chemistries  Recent Labs  Lab 11/08/17 0549  NA 140  K 4.7  CL 108  CO2 20*  GLUCOSE 74  BUN 21*  CREATININE  0.90  CALCIUM 9.1  MG 1.7    Follow-up Information    Ezequiel Kayser, MD. Schedule an appointment as soon as possible for a visit in 1 week(s).   Specialty:  Internal Medicine Contact information: [address] [place] [address] [phone]          Management plans discussed with the patient, family and they are in agreement.  CODE STATUS: DNR, comfort care   TOTAL TIME TAKING CARE OF THIS PATIENT: 45 minutes.    Max Sane M.D on 11/09/2017 at 7:13 AM  Between 7am to 6pm - Pager - [phone]  After 6pm go to [email or web address] - Technical brewer [place] [facility]  Office  [phone]  CC: Primary care physician; Ezequiel Kayser, MD   Note: This dictation was prepared with Dragon dictation along with smaller phrase technology. Any transcriptional errors that result from this process are unintentional.

## 2017-11-09 NOTE — Care Management Important Message (Signed)
Important Message  Patient Details  Name: Emily Moyer MRN: [id number] Date of Birth: May 04, 1924   Medicare Important Message Given:  Yes    Shelbie Ammons, RN 11/09/2017, 6:41 AM

## 2017-11-20 DEATH — deceased

## 2018-04-14 IMAGING — CT CT ANGIO NECK
1 of 18 series · 3 of 34 positions shown · IV contrast (iopamidol)
Comparison: Noncontrast head CT from 2 days ago

CLINICAL DATA: Stroke follow-up

EXAM:
CT ANGIOGRAPHY HEAD AND NECK
TECHNIQUE: Multidetector CT imaging of the head and neck was performed using
the standard protocol during bolus administration of intravenous
contrast. Multiplanar CT image reconstructions and MIPs were
obtained to evaluate the vascular anatomy. Carotid stenosis
measurements (when applicable) are obtained utilizing NASCET
criteria, using the distal internal carotid diameter as the
denominator.
CONTRAST:  60mL D4EXM8-0D9 IOPAMIDOL (D4EXM8-0D9) INJECTION 76%

[Series 6: ax thin · axial · 0.36mm/px · z∈[-311,-139]mm · 3 of 318 slices shown]
[im 80/318  soft-tissue]
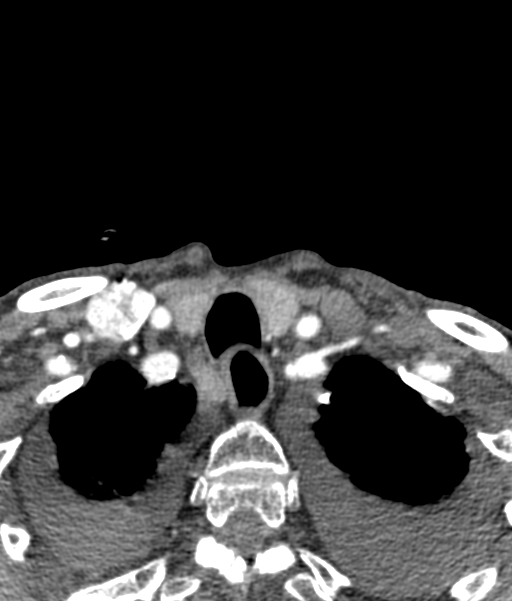
[im 159/318  bone]
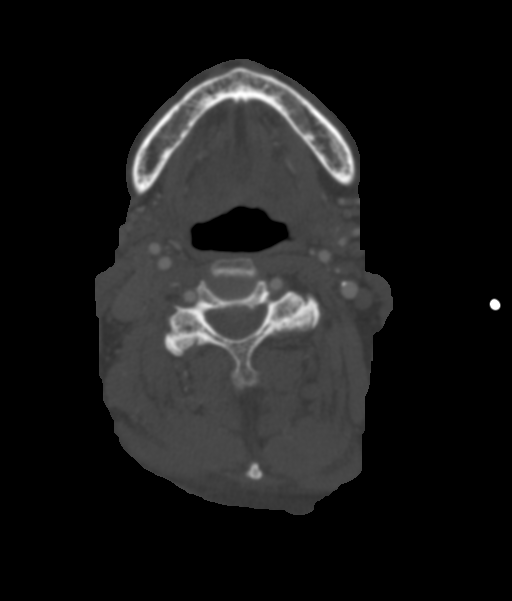
[im 238/318  soft-tissue]
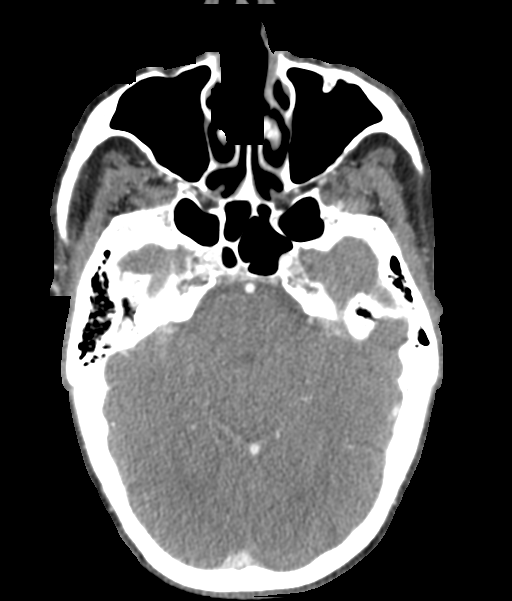

[3 of 34 positions shown; findings below may reference images not displayed]

FINDINGS: CT HEAD FINDINGS

Brain: There is a moderate to large right lateral frontal infarct
with cytotoxic edema pattern. Superficially there is some high
density suggesting hemorrhage. Residual visible cortex could
contribute to this appearance. There is a remote right parietal
cortex infarct that is moderate size. Remote perforator infarct in
the left basal ganglia. No hydrocephalus.

Vascular: See below

Skull: Negative

Sinuses: Negative

Orbits: Senescent globe calcifications.  No acute finding.

Review of the MIP images confirms the above findings

CTA NECK FINDINGS

Aortic arch: Diffuse atherosclerotic plaque. No dilatation or
dissection seen. Three vessel branching.

Right carotid system: Atheromatous plaque at the common carotid
bifurcation. No flow limiting stenosis or ulceration. Negative for
beading or dissection.

Left carotid system: Moderate atheromatous plaque at the common
carotid bifurcation and ICA bulb. There is milder plaque along the
common carotid. Proximal left ICA stenosis measures 60% on coronal
reformats. No dissection or ulceration.

Vertebral arteries: No proximal subclavian flow limiting stenosis.
Codominant vertebral arteries. Mild atherosclerotic plaque at the
right vertebral origin. Both vertebral arteries are diffusely patent
and smooth.

Skeleton: Usual degenerative changes. Osteopenia. No acute or
aggressive finding.

Other neck: 23 mm nodule in the left thyroid. 17 mm nodule in the
right thyroid. These are likely incidental given patient's
condition. The larger is peripherally calcified.

Upper chest: Layering pleural effusions reaching the apex, also seen
10/20/2017 by CT.

Review of the MIP images confirms the above findings

CTA HEAD FINDINGS

Anterior circulation: Atherosclerotic plaque on both carotid
siphons. The left MCA is occluded at the origin with faint
downstream reconstitution. On the right side that shows acute
infarct there is no proximal stenosis or occlusion.

Posterior circulation: Codominant vertebral arteries.
Vertebrobasilar arteries are smooth and diffusely patent. Symmetric
and good flow in bilateral posterior cerebral arteries.

Venous sinuses: Patent

Anatomic variants: None unusual

Delayed phase: No abnormal intracranial enhancement.

These results were called by telephone at the time of interpretation
on 11/02/2017 at [DATE] to Dr. LUISA FERNANDA FUSAGASUGA , who verbally
acknowledged these results.

Review of the MIP images confirms the above findings
IMPRESSION: 1. Moderate to large right frontal acute infarct. There is likely
small volume superficial/petechial hemorrhage.No flow limiting
stenosis or embolic source seen in the right anterior circulation.
2. Left MCA origin occlusion with limited branch opacification in
the arterial phase. No visible left hemispheric infarct, this is an
age indeterminate finding.
3. 60% atheromatous left ICA bulb narrowing.
4. Remote right parietal and left lateral lenticulostriate infarcts.
5. Layering pleural effusions also seen 10/20/2017.
